# Patient Record
Sex: Female | Born: 1945 | Race: Black or African American | Hispanic: No | Marital: Single | Smoking: Former smoker
Health system: Southern US, Community
[De-identification: ages and names within clinical notes are randomized; demographics above are authoritative.]

## PROBLEM LIST (undated history)

## (undated) DIAGNOSIS — J449 Chronic obstructive pulmonary disease, unspecified: Secondary | ICD-10-CM

## (undated) DIAGNOSIS — I10 Essential (primary) hypertension: Secondary | ICD-10-CM

## (undated) DIAGNOSIS — D649 Anemia, unspecified: Secondary | ICD-10-CM

## (undated) DIAGNOSIS — M1990 Unspecified osteoarthritis, unspecified site: Secondary | ICD-10-CM

## (undated) HISTORY — PX: NO PAST SURGERIES: SHX2092

## (undated) HISTORY — DX: Anemia, unspecified: D64.9

---

## 2015-06-09 ENCOUNTER — Ambulatory Visit: Payer: Self-pay | Admitting: Unknown Physician Specialty

## 2016-09-27 ENCOUNTER — Encounter: Payer: Self-pay | Admitting: *Deleted

## 2016-09-28 ENCOUNTER — Encounter
Admission: RE | Disposition: A | Discharge status: Home / Self Care | Payer: Self-pay | Source: Ambulatory Visit | Attending: Unknown Physician Specialty

## 2016-09-28 ENCOUNTER — Encounter: Payer: Self-pay | Admitting: *Deleted

## 2016-09-28 ENCOUNTER — Ambulatory Visit: Payer: Medicare HMO | Admitting: Anesthesiology

## 2016-09-28 ENCOUNTER — Ambulatory Visit
Admission: RE | Admit: 2016-09-28 | Discharge: 2016-09-28 | Disposition: A | Discharge status: Home / Self Care | Payer: Medicare HMO | Source: Ambulatory Visit | Attending: Unknown Physician Specialty | Admitting: Unknown Physician Specialty

## 2016-09-28 DIAGNOSIS — R1310 Dysphagia, unspecified: Secondary | ICD-10-CM | POA: Insufficient documentation

## 2016-09-28 DIAGNOSIS — K2950 Unspecified chronic gastritis without bleeding: Secondary | ICD-10-CM | POA: Insufficient documentation

## 2016-09-28 DIAGNOSIS — D123 Benign neoplasm of transverse colon: Secondary | ICD-10-CM | POA: Insufficient documentation

## 2016-09-28 DIAGNOSIS — K210 Gastro-esophageal reflux disease with esophagitis: Secondary | ICD-10-CM | POA: Insufficient documentation

## 2016-09-28 DIAGNOSIS — M1990 Unspecified osteoarthritis, unspecified site: Secondary | ICD-10-CM | POA: Diagnosis not present

## 2016-09-28 DIAGNOSIS — J449 Chronic obstructive pulmonary disease, unspecified: Secondary | ICD-10-CM | POA: Insufficient documentation

## 2016-09-28 DIAGNOSIS — I10 Essential (primary) hypertension: Secondary | ICD-10-CM | POA: Insufficient documentation

## 2016-09-28 DIAGNOSIS — K648 Other hemorrhoids: Secondary | ICD-10-CM | POA: Insufficient documentation

## 2016-09-28 DIAGNOSIS — D509 Iron deficiency anemia, unspecified: Secondary | ICD-10-CM | POA: Insufficient documentation

## 2016-09-28 DIAGNOSIS — Z87891 Personal history of nicotine dependence: Secondary | ICD-10-CM | POA: Diagnosis not present

## 2016-09-28 DIAGNOSIS — K319 Disease of stomach and duodenum, unspecified: Secondary | ICD-10-CM | POA: Insufficient documentation

## 2016-09-28 DIAGNOSIS — K449 Diaphragmatic hernia without obstruction or gangrene: Secondary | ICD-10-CM | POA: Diagnosis not present

## 2016-09-28 HISTORY — PX: COLONOSCOPY WITH PROPOFOL: SHX5780

## 2016-09-28 HISTORY — DX: Essential (primary) hypertension: I10

## 2016-09-28 HISTORY — PX: ESOPHAGOGASTRODUODENOSCOPY (EGD) WITH PROPOFOL: SHX5813

## 2016-09-28 HISTORY — DX: Chronic obstructive pulmonary disease, unspecified: J44.9

## 2016-09-28 HISTORY — DX: Unspecified osteoarthritis, unspecified site: M19.90

## 2016-09-28 SURGERY — COLONOSCOPY WITH PROPOFOL
Anesthesia: General

## 2016-09-28 MED ORDER — SODIUM CHLORIDE 0.9 % IV SOLN
INTRAVENOUS | Status: DC
  Administered 2016-09-28: 1000 mL via INTRAVENOUS

## 2016-09-28 MED ORDER — SODIUM CHLORIDE 0.9 % IV SOLN
INTRAVENOUS | Status: DC
  Administered 2016-09-28: 14:00:00 via INTRAVENOUS

## 2016-09-28 MED ORDER — PROPOFOL 500 MG/50ML IV EMUL
INTRAVENOUS | Status: DC | PRN
  Administered 2016-09-28: 50 ug/kg/min via INTRAVENOUS

## 2016-09-28 NOTE — H&P (Signed)
   Primary Care Physician:  Letta Median, MD Primary Gastroenterologist:  Dr. Vira Agar  Pre-Procedure History & Physical: HPI:  Joan Parker is a 69 y.o. female is here for an endoscopy and colonoscopy.   Past Medical History:  Diagnosis Date  . Arthritis   . COPD (chronic obstructive pulmonary disease) ([place])   . Hypertension     Past Surgical History:  Procedure Laterality Date  . NO PAST SURGERIES      Prior to Admission medications   Medication Sig Start Date End Date Taking? Authorizing Provider  alendronate (FOSAMAX) 70 MG tablet Take 70 mg by mouth once a week. Take with a full glass of water on an empty stomach.   Yes Historical Provider, MD  amLODipine (NORVASC) 5 MG tablet Take 5 mg by mouth daily.   Yes Historical Provider, MD  budesonide-formoterol (SYMBICORT) 160-4.5 MCG/ACT inhaler Inhale 2 puffs into the lungs 2 (two) times daily.   Yes Historical Provider, MD  diclofenac (CATAFLAM) 50 MG tablet Take 50 mg by mouth 2 (two) times daily.   Yes Historical Provider, MD  diclofenac (VOLTAREN) 50 MG EC tablet Take 50 mg by mouth 2 (two) times daily as needed.   Yes Historical Provider, MD  ergocalciferol (VITAMIN D2) 50000 units capsule Take 50,000 Units by mouth once a week.   Yes Historical Provider, MD  leflunomide (ARAVA) 20 MG tablet Take 20 mg by mouth daily.   Yes Historical Provider, MD  lisinopril (PRINIVIL,ZESTRIL) 40 MG tablet Take 40 mg by mouth daily.   Yes Historical Provider, MD  predniSONE (DELTASONE) 5 MG tablet Take 5 mg by mouth daily with breakfast. Take 1 and 1/2 tab daily for 30 days   Yes Historical Provider, MD    Allergies as of 09/20/2016  . (Not on File)    History reviewed. No pertinent family history.  Social History   Social History  . Marital status: Single    Spouse name: N/A  . Number of children: N/A  . Years of education: N/A   Occupational History  . Not on file.   Social History Main Topics  . Smoking status: Former  Research scientist (life sciences)  . Smokeless tobacco: Never Used  . Alcohol use No  . Drug use: No  . Sexual activity: Not on file   Other Topics Concern  . Not on file   Social History Narrative  . No narrative on file    Review of Systems: See HPI, otherwise negative ROS  Physical Exam: BP 108/78   Pulse 88   Temp 97.5 F (36.4 C)   Resp 20   Ht 5\' 5"  (1.651 m)   Wt 63 kg (139 lb)   SpO2 98%   BMI 23.13 kg/m  General:   Alert,  pleasant and cooperative in NAD Head:  Normocephalic and atraumatic. Neck:  Supple; no masses or thyromegaly. Lungs:  Clear throughout to auscultation.    Heart:  Regular rate and rhythm. Abdomen:  Soft, nontender and nondistended. Normal bowel sounds, without guarding, and without rebound.   Neurologic:  Alert and  oriented x4;  grossly normal neurologically.  Impression/Plan: Joan Parker is here for an endoscopy and colonoscopy to be performed for iron def anemia  Risks, benefits, limitations, and alternatives regarding  endoscopy and colonoscopy have been reviewed with the patient.  Questions have been answered.  All parties agreeable.   Gaylyn Cheers, MD  09/28/2016, 1:46 PM

## 2016-09-28 NOTE — Op Note (Signed)
[facility] Gastroenterology Patient Name: Joan Parker Procedure Date: 09/28/2016 1:52 PM MRN: [id number] Account #: [id number] Date of Birth: 02-08-1946 Admit Type: Outpatient Age: 70 Room: [facility] ENDO ROOM 4 Gender: Female Note Status: Finalized Procedure:            Colonoscopy Indications:          Iron deficiency anemia Providers:            Manya Silvas, MD Referring MD:         Baxter Kail. Rebeca Alert MD, MD (Referring MD) Medicines:            Propofol per Anesthesia Complications:        No immediate complications. Procedure:            Pre-Anesthesia Assessment:                       - After reviewing the risks and benefits, the patient                        was deemed in satisfactory condition to undergo the                        procedure.                       After obtaining informed consent, the colonoscope was                        passed under direct vision. Throughout the procedure,                        the patient's blood pressure, pulse, and oxygen                        saturations were monitored continuously. The                        Colonoscope was introduced through the anus and                        advanced with certainity to the the cecum, identified                        by appendiceal orifice and ileocecal valve. The                        colonoscopy was performed without difficulty. The                        patient tolerated the procedure well. The quality of                        the bowel preparation was excellent. Findings:      A diminutive polyp was found in the transverse colon. The polyp was       sessile. The polyp was removed with a jumbo cold forceps. Resection and       retrieval were complete.      Internal hemorrhoids were found. The hemorrhoids were small and Grade I       (internal hemorrhoids that do not prolapse).      The exam was  otherwise without abnormality. Impression:           - One diminutive polyp  in the transverse colon, removed                        with a jumbo cold forceps. Resected and retrieved.                       - Internal hemorrhoids.                       - The examination was otherwise normal. Recommendation:       - Await pathology results. Manya Silvas, MD 09/28/2016 2:34:30 PM This report has been signed electronically. Number of Addenda: 0 Note Initiated On: 09/28/2016 1:52 PM Scope Withdrawal Time: 0 hours 10 minutes 8 seconds  Total Procedure Duration: 0 hours 19 minutes 55 seconds       [facility]

## 2016-09-28 NOTE — Transfer of Care (Signed)
Immediate Anesthesia Transfer of Care Note  Patient: Joan Parker  Procedure(s) Performed: Procedure(s): COLONOSCOPY WITH PROPOFOL (N/A) ESOPHAGOGASTRODUODENOSCOPY (EGD) WITH PROPOFOL (N/A)  Patient Location: PACU  Anesthesia Type:General  Level of Consciousness: awake, alert  and oriented  Airway & Oxygen Therapy: Patient Spontanous Breathing and Patient connected to nasal cannula oxygen  Post-op Assessment: Report given to RN and Post -op Vital signs reviewed and stable  Post vital signs: Reviewed and stable  Last Vitals:  Vitals:   09/28/16 1308 09/28/16 1438  BP: 108/78 94/73  Pulse: 88 71  Resp: 20 20  Temp: 36.4 C 36.2 C    Last Pain:  Vitals:   09/28/16 1438  TempSrc: Tympanic         Complications: No apparent anesthesia complications

## 2016-09-28 NOTE — Op Note (Signed)
[facility] Gastroenterology Patient Name: Joan Parker Procedure Date: 09/28/2016 1:52 PM MRN: [id number] Account #: [id number] Date of Birth: [birth year] Admit Type: Outpatient Age: 70 Room: [facility] ENDO ROOM 4 Gender: Female Note Status: Finalized Procedure:            Upper GI endoscopy Indications:          Iron deficiency anemia, Dysphagia Providers:            Manya Silvas, MD Referring MD:         Baxter Kail. Rebeca Alert MD, MD (Referring MD) Medicines:            Propofol per Anesthesia Complications:        No immediate complications. Procedure:            Pre-Anesthesia Assessment:                       - After reviewing the risks and benefits, the patient                        was deemed in satisfactory condition to undergo the                        procedure.                       After obtaining informed consent, the endoscope was                        passed under direct vision. Throughout the procedure,                        the patient's blood pressure, pulse, and oxygen                        saturations were monitored continuously. The                        Colonoscope was introduced through the mouth, and                        advanced to the second part of duodenum. The upper GI                        endoscopy was accomplished without difficulty. The                        patient tolerated the procedure well. Findings:      LA Grade A (one or more mucosal breaks less than 5 mm, not extending       between tops of 2 mucosal folds) esophagitis with no bleeding was found       35 cm from the incisors. A guidewire was placed and the scope was       withdrawn. Dilation was performed with a Savary dilator with no       resistance at 17 mm.      A small hiatal hernia was present.      A single, small non-bleeding erosion was found in the gastric antrum.       There were stigmata of recent bleeding. There were flecks of blood in       the antrum and  body of uncertain origin. Biopsies were taken with a cold       forceps for histology. Biopsies were taken with a cold forceps for       Helicobacter pylori testing.      The examined duodenum was normal. Impression:           - LA Grade A reflux esophagitis. Dilated.                       - Small hiatal hernia.                       - Non-bleeding erosive gastropathy. Biopsied.                       - Normal examined duodenum. Recommendation:       - Await pathology results. Manya Silvas, MD 09/28/2016 2:10:07 PM This report has been signed electronically. Number of Addenda: 0 Note Initiated On: 09/28/2016 1:52 PM      [facility]

## 2016-09-28 NOTE — Anesthesia Preprocedure Evaluation (Addendum)
Anesthesia Evaluation  Patient identified by MRN, date of birth, ID band Patient awake    Reviewed: Allergy & Precautions  Airway Mallampati: II       Dental  (+) Upper Dentures, Lower Dentures   Pulmonary COPD,  COPD inhaler, former smoker,     + decreased breath sounds      Cardiovascular Exercise Tolerance: Good hypertension, Pt. on medications  Rhythm:Regular     Neuro/Psych negative neurological ROS     GI/Hepatic negative GI ROS, Neg liver ROS,   Endo/Other  negative endocrine ROS  Renal/GU negative Renal ROS     Musculoskeletal   Abdominal Normal abdominal exam  (+)   Peds  Hematology negative hematology ROS (+)   Anesthesia Other Findings   Reproductive/Obstetrics                             Anesthesia Physical Anesthesia Plan  ASA: III  Anesthesia Plan: General   Post-op Pain Management:    Induction: Intravenous  Airway Management Planned: Natural Airway and Nasal Cannula  Additional Equipment:   Intra-op Plan:   Post-operative Plan:   Informed Consent: I have reviewed the patients History and Physical, chart, labs and discussed the procedure including the risks, benefits and alternatives for the proposed anesthesia with the patient or authorized representative who has indicated his/her understanding and acceptance.     Plan Discussed with: CRNA  Anesthesia Plan Comments:         Anesthesia Quick Evaluation

## 2016-09-29 ENCOUNTER — Encounter: Payer: Self-pay | Admitting: Unknown Physician Specialty

## 2016-09-30 LAB — SURGICAL PATHOLOGY

## 2016-09-30 NOTE — Anesthesia Postprocedure Evaluation (Signed)
Anesthesia Post Note  Patient: Darl Householder  Procedure(s) Performed: Procedure(s) (LRB): COLONOSCOPY WITH PROPOFOL (N/A) ESOPHAGOGASTRODUODENOSCOPY (EGD) WITH PROPOFOL (N/A)  Patient location during evaluation: PACU Anesthesia Type: General Level of consciousness: awake Pain management: pain level controlled Vital Signs Assessment: post-procedure vital signs reviewed and stable Cardiovascular status: stable Anesthetic complications: no    Last Vitals:  Vitals:   09/28/16 1458 09/28/16 1508  BP: 138/84 (!) 143/85  Pulse: 64 66  Resp: 16 15  Temp:      Last Pain:  Vitals:   09/29/16 0744  TempSrc:   PainSc: 0-No pain                 VAN STAVEREN,Octaviano Mukai

## 2016-12-19 ENCOUNTER — Other Ambulatory Visit: Payer: Self-pay | Admitting: Internal Medicine

## 2016-12-19 DIAGNOSIS — R0781 Pleurodynia: Secondary | ICD-10-CM

## 2016-12-20 ENCOUNTER — Ambulatory Visit
Admission: RE | Admit: 2016-12-20 | Discharge: 2016-12-20 | Disposition: A | Discharge status: Home / Self Care | Payer: Medicare HMO | Source: Ambulatory Visit | Attending: Internal Medicine | Admitting: Internal Medicine

## 2016-12-20 DIAGNOSIS — R0781 Pleurodynia: Secondary | ICD-10-CM | POA: Diagnosis not present

## 2016-12-20 DIAGNOSIS — J439 Emphysema, unspecified: Secondary | ICD-10-CM | POA: Diagnosis not present

## 2016-12-20 DIAGNOSIS — M4854XA Collapsed vertebra, not elsewhere classified, thoracic region, initial encounter for fracture: Secondary | ICD-10-CM | POA: Diagnosis not present

## 2016-12-20 DIAGNOSIS — J984 Other disorders of lung: Secondary | ICD-10-CM | POA: Diagnosis not present

## 2016-12-20 DIAGNOSIS — I77810 Thoracic aortic ectasia: Secondary | ICD-10-CM | POA: Insufficient documentation

## 2016-12-20 MED ORDER — IOPAMIDOL (ISOVUE-300) INJECTION 61%: 100.0000 mL | Freq: Once | INTRAVENOUS | Status: DC | PRN

## 2016-12-20 MED ORDER — IOPAMIDOL (ISOVUE-370) INJECTION 76%
100.0000 mL | Freq: Once | INTRAVENOUS | Status: AC | PRN
  Administered 2016-12-20: 85 mL via INTRAVENOUS

## 2017-02-08 ENCOUNTER — Other Ambulatory Visit: Payer: Self-pay | Admitting: Family Medicine

## 2017-02-08 DIAGNOSIS — R1319 Other dysphagia: Secondary | ICD-10-CM

## 2017-02-14 ENCOUNTER — Ambulatory Visit: Admission: RE | Admit: 2017-02-14 | Payer: Medicare HMO | Source: Ambulatory Visit

## 2017-09-19 ENCOUNTER — Other Ambulatory Visit: Payer: Self-pay | Admitting: Family Medicine

## 2017-09-19 DIAGNOSIS — Z1231 Encounter for screening mammogram for malignant neoplasm of breast: Secondary | ICD-10-CM

## 2017-10-09 ENCOUNTER — Ambulatory Visit
Admission: RE | Admit: 2017-10-09 | Discharge: 2017-10-09 | Disposition: A | Discharge status: Home / Self Care | Payer: Medicare HMO | Source: Ambulatory Visit | Attending: Family Medicine | Admitting: Family Medicine

## 2017-10-09 DIAGNOSIS — Z1231 Encounter for screening mammogram for malignant neoplasm of breast: Secondary | ICD-10-CM

## 2017-10-11 ENCOUNTER — Other Ambulatory Visit: Payer: Self-pay | Admitting: Family Medicine

## 2017-10-11 DIAGNOSIS — N6489 Other specified disorders of breast: Secondary | ICD-10-CM

## 2017-10-11 DIAGNOSIS — R928 Other abnormal and inconclusive findings on diagnostic imaging of breast: Secondary | ICD-10-CM

## 2017-10-26 ENCOUNTER — Ambulatory Visit
Admission: RE | Admit: 2017-10-26 | Discharge: 2017-10-26 | Disposition: A | Discharge status: Home / Self Care | Payer: Medicare HMO | Source: Ambulatory Visit | Attending: Family Medicine | Admitting: Family Medicine

## 2017-10-26 DIAGNOSIS — N6489 Other specified disorders of breast: Secondary | ICD-10-CM | POA: Insufficient documentation

## 2017-10-26 DIAGNOSIS — R928 Other abnormal and inconclusive findings on diagnostic imaging of breast: Secondary | ICD-10-CM

## 2018-07-12 ENCOUNTER — Other Ambulatory Visit: Payer: Self-pay | Admitting: Student

## 2018-07-12 DIAGNOSIS — R1310 Dysphagia, unspecified: Secondary | ICD-10-CM

## 2018-07-12 DIAGNOSIS — R1319 Other dysphagia: Secondary | ICD-10-CM

## 2018-07-18 ENCOUNTER — Ambulatory Visit
Admission: RE | Admit: 2018-07-18 | Discharge: 2018-07-18 | Disposition: A | Discharge status: Home / Self Care | Payer: Medicare HMO | Source: Ambulatory Visit | Attending: Student | Admitting: Student

## 2018-07-18 DIAGNOSIS — R1319 Other dysphagia: Secondary | ICD-10-CM

## 2018-07-18 DIAGNOSIS — R1310 Dysphagia, unspecified: Secondary | ICD-10-CM | POA: Insufficient documentation

## 2018-09-17 ENCOUNTER — Other Ambulatory Visit: Payer: Self-pay | Admitting: Family Medicine

## 2018-09-17 DIAGNOSIS — Z1231 Encounter for screening mammogram for malignant neoplasm of breast: Secondary | ICD-10-CM

## 2019-04-01 DIAGNOSIS — J449 Chronic obstructive pulmonary disease, unspecified: Secondary | ICD-10-CM | POA: Diagnosis not present

## 2019-04-04 DIAGNOSIS — J439 Emphysema, unspecified: Secondary | ICD-10-CM | POA: Diagnosis not present

## 2019-04-23 DIAGNOSIS — D509 Iron deficiency anemia, unspecified: Secondary | ICD-10-CM | POA: Diagnosis not present

## 2019-04-23 DIAGNOSIS — I10 Essential (primary) hypertension: Secondary | ICD-10-CM | POA: Diagnosis not present

## 2019-04-23 DIAGNOSIS — N183 Chronic kidney disease, stage 3 (moderate): Secondary | ICD-10-CM | POA: Diagnosis not present

## 2019-04-23 DIAGNOSIS — D508 Other iron deficiency anemias: Secondary | ICD-10-CM | POA: Diagnosis not present

## 2019-04-24 DIAGNOSIS — M0579 Rheumatoid arthritis with rheumatoid factor of multiple sites without organ or systems involvement: Secondary | ICD-10-CM | POA: Diagnosis not present

## 2019-04-30 DIAGNOSIS — Z0000 Encounter for general adult medical examination without abnormal findings: Secondary | ICD-10-CM | POA: Diagnosis not present

## 2019-04-30 DIAGNOSIS — Z136 Encounter for screening for cardiovascular disorders: Secondary | ICD-10-CM | POA: Diagnosis not present

## 2019-04-30 DIAGNOSIS — I10 Essential (primary) hypertension: Secondary | ICD-10-CM | POA: Diagnosis not present

## 2019-04-30 DIAGNOSIS — K219 Gastro-esophageal reflux disease without esophagitis: Secondary | ICD-10-CM | POA: Diagnosis not present

## 2019-04-30 DIAGNOSIS — D508 Other iron deficiency anemias: Secondary | ICD-10-CM | POA: Diagnosis not present

## 2019-04-30 DIAGNOSIS — N183 Chronic kidney disease, stage 3 (moderate): Secondary | ICD-10-CM | POA: Diagnosis not present

## 2019-05-02 DIAGNOSIS — J449 Chronic obstructive pulmonary disease, unspecified: Secondary | ICD-10-CM | POA: Diagnosis not present

## 2019-05-14 ENCOUNTER — Encounter: Payer: Self-pay | Admitting: Internal Medicine

## 2019-05-14 ENCOUNTER — Other Ambulatory Visit: Payer: Self-pay

## 2019-05-14 ENCOUNTER — Inpatient Hospital Stay: Payer: Medicare HMO | Attending: Internal Medicine | Admitting: Internal Medicine

## 2019-05-14 ENCOUNTER — Inpatient Hospital Stay: Payer: Medicare HMO

## 2019-05-14 DIAGNOSIS — D509 Iron deficiency anemia, unspecified: Secondary | ICD-10-CM | POA: Insufficient documentation

## 2019-05-14 DIAGNOSIS — I10 Essential (primary) hypertension: Secondary | ICD-10-CM | POA: Insufficient documentation

## 2019-05-14 DIAGNOSIS — Z79899 Other long term (current) drug therapy: Secondary | ICD-10-CM | POA: Diagnosis not present

## 2019-05-14 DIAGNOSIS — Z87891 Personal history of nicotine dependence: Secondary | ICD-10-CM | POA: Insufficient documentation

## 2019-05-14 DIAGNOSIS — J449 Chronic obstructive pulmonary disease, unspecified: Secondary | ICD-10-CM | POA: Insufficient documentation

## 2019-05-14 DIAGNOSIS — Z7951 Long term (current) use of inhaled steroids: Secondary | ICD-10-CM | POA: Insufficient documentation

## 2019-05-14 DIAGNOSIS — N189 Chronic kidney disease, unspecified: Secondary | ICD-10-CM | POA: Diagnosis not present

## 2019-05-14 LAB — CBC WITH DIFFERENTIAL/PLATELET
Abs Immature Granulocytes: 0.02 10*3/uL (ref 0.00–0.07)
Basophils Absolute: 0.1 10*3/uL (ref 0.0–0.1)
Basophils Relative: 1 %
Eosinophils Absolute: 0.2 10*3/uL (ref 0.0–0.5)
Eosinophils Relative: 3 %
HCT: 33.6 % — ABNORMAL LOW (ref 36.0–46.0)
Hemoglobin: 9.8 g/dL — ABNORMAL LOW (ref 12.0–15.0)
Immature Granulocytes: 0 %
Lymphocytes Relative: 15 %
Lymphs Abs: 1.1 10*3/uL (ref 0.7–4.0)
MCH: 22.3 pg — ABNORMAL LOW (ref 26.0–34.0)
MCHC: 29.2 g/dL — ABNORMAL LOW (ref 30.0–36.0)
MCV: 76.5 fL — ABNORMAL LOW (ref 80.0–100.0)
Monocytes Absolute: 0.6 10*3/uL (ref 0.1–1.0)
Monocytes Relative: 8 %
Neutro Abs: 5.3 10*3/uL (ref 1.7–7.7)
Neutrophils Relative %: 73 %
Platelets: 201 10*3/uL (ref 150–400)
RBC: 4.39 MIL/uL (ref 3.87–5.11)
RDW: 15.7 % — ABNORMAL HIGH (ref 11.5–15.5)
WBC: 7.3 10*3/uL (ref 4.0–10.5)
nRBC: 0 % (ref 0.0–0.2)

## 2019-05-14 LAB — URINALYSIS, COMPLETE (UACMP) WITH MICROSCOPIC
Bacteria, UA: NONE SEEN
Bilirubin Urine: NEGATIVE
Glucose, UA: NEGATIVE mg/dL
Hgb urine dipstick: NEGATIVE
Ketones, ur: NEGATIVE mg/dL
Nitrite: NEGATIVE
Protein, ur: NEGATIVE mg/dL
Specific Gravity, Urine: 1.021 (ref 1.005–1.030)
pH: 6 (ref 5.0–8.0)

## 2019-05-14 LAB — COMPREHENSIVE METABOLIC PANEL
ALT: 13 U/L (ref 0–44)
AST: 22 U/L (ref 15–41)
Albumin: 4.2 g/dL (ref 3.5–5.0)
Alkaline Phosphatase: 48 U/L (ref 38–126)
Anion gap: 8 (ref 5–15)
BUN: 34 mg/dL — ABNORMAL HIGH (ref 8–23)
CO2: 30 mmol/L (ref 22–32)
Calcium: 9.6 mg/dL (ref 8.9–10.3)
Chloride: 104 mmol/L (ref 98–111)
Creatinine, Ser: 1.48 mg/dL — ABNORMAL HIGH (ref 0.44–1.00)
GFR calc Af Amer: 40 mL/min — ABNORMAL LOW (ref >60)
GFR calc non Af Amer: 35 mL/min — ABNORMAL LOW (ref >60)
Glucose, Bld: 90 mg/dL (ref 70–99)
Potassium: 4.3 mmol/L (ref 3.5–5.1)
Sodium: 142 mmol/L (ref 135–145)
Total Bilirubin: 0.3 mg/dL (ref 0.3–1.2)
Total Protein: 7.5 g/dL (ref 6.5–8.1)

## 2019-05-14 LAB — RETICULOCYTES
Immature Retic Fract: 6.4 % (ref 2.3–15.9)
RBC.: 4.39 MIL/uL (ref 3.87–5.11)
Retic Count, Absolute: 36 10*3/uL (ref 19.0–186.0)
Retic Ct Pct: 0.8 % (ref 0.4–3.1)

## 2019-05-14 LAB — C-REACTIVE PROTEIN: CRP: 1 mg/dL — ABNORMAL HIGH (ref <1.0)

## 2019-05-14 LAB — LACTATE DEHYDROGENASE: LDH: 131 U/L (ref 98–192)

## 2019-05-14 LAB — TECHNOLOGIST SMEAR REVIEW

## 2019-05-14 NOTE — Progress Notes (Signed)
[facility] NOTE  Patient Care Team: Letta Median, MD as PCP - General (Family Medicine)  CHIEF COMPLAINTS/PURPOSE OF CONSULTATION:    HEMATOLOGY HISTORY  # ANEMIA EGD/ colonoscopy ~ 2017 [Dr.Elliot]- ;capsule-none;  Bone marrow Biopsy-none   # COPD on 2 Lit/24 [Dr.Fleming];   HISTORY OF PRESENTING ILLNESS:  Joan Parker 73 y.o.  female has been referred to [place] for further evaluation/work-up for anemia.  Patient denies any blood in stools or black or stools.  Denies any vaginal bleeding.  No weight loss.  Not on iron supplementation.  No blood in urine.  Patient was to have worsening shortness of breath/fatigue.  She is on 2 L of oxygen.  Review of Systems  Constitutional: Positive for malaise/fatigue. Negative for chills, diaphoresis, fever and weight loss.  HENT: Negative for nosebleeds and sore throat.   Eyes: Negative for double vision.  Respiratory: Negative for cough, hemoptysis, sputum production, shortness of breath and wheezing.   Cardiovascular: Negative for chest pain, palpitations, orthopnea and leg swelling.  Gastrointestinal: Negative for abdominal pain, blood in stool, constipation, diarrhea, heartburn, melena, nausea and vomiting.  Genitourinary: Negative for dysuria, frequency and urgency.  Musculoskeletal: Positive for back pain and joint pain.  Skin: Negative.  Negative for itching and rash.  Neurological: Negative for dizziness, tingling, focal weakness, weakness and headaches.  Endo/Heme/Allergies: Does not bruise/bleed easily.  Psychiatric/Behavioral: Negative for depression. The patient is not nervous/anxious and does not have insomnia.     MEDICAL HISTORY:  Past Medical History:  Diagnosis Date  . Anemia   . Arthritis   . COPD (chronic obstructive pulmonary disease) ([place])   . Hypertension     SURGICAL HISTORY: Past Surgical History:  Procedure Laterality Date  . COLONOSCOPY WITH PROPOFOL N/A 09/28/2016   Procedure:  COLONOSCOPY WITH PROPOFOL;  Surgeon: Manya Silvas, MD;  Location: [facility] ENDOSCOPY;  Service: Endoscopy;  Laterality: N/A;  . ESOPHAGOGASTRODUODENOSCOPY (EGD) WITH PROPOFOL N/A 09/28/2016   Procedure: ESOPHAGOGASTRODUODENOSCOPY (EGD) WITH PROPOFOL;  Surgeon: Manya Silvas, MD;  [facility] ENDOSCOPY;  Service: Endoscopy;  Laterality: N/A;    SOCIAL HISTORY: Social History   Socioeconomic History  . Marital status: Single    Spouse name: Not on file  . Number of children: Not on file  . Years of education: Not on file  . Highest education level: Not on file  Occupational History  . Not on file  Social Needs  . Financial resource strain: Not on file  . Food insecurity    Worry: Not on file    Inability: Not on file  . Transportation needs    Medical: Not on file    Non-medical: Not on file  Tobacco Use  . Smoking status: Former Smoker    Packs/day: 1.00    Years: 15.00    Pack years: 15.00    Types: Cigarettes  . Smokeless tobacco: Never Used  Substance and Sexual Activity  . Alcohol use: No  . Drug use: No  . Sexual activity: Not on file  Lifestyle  . Physical activity    Days per week: Not on file    Minutes per session: Not on file  . Stress: Not on file  Relationships  . Social Herbalist on phone: Not on file    Gets together: Not on file    Attends religious service: Not on file    Active member of club or organization: Not on file    Attends meetings  of clubs or organizations: Not on file    Relationship status: Not on file  . Intimate partner violence    Fear of current or ex partner: Not on file    Emotionally abused: Not on file    Physically abused: Not on file    Forced sexual activity: Not on file  Other Topics Concern  . Not on file  Social History Narrative  . Not on file    FAMILY HISTORY: Family History  Problem Relation Age of Onset  . Diabetes Father     ALLERGIES:  has No Known Allergies.  MEDICATIONS:  Current  Outpatient Medications  Medication Sig Dispense Refill  . albuterol (VENTOLIN HFA) 108 (90 Base) MCG/ACT inhaler Inhale 2 Inhalers into the lungs every 4 (four) hours as needed for shortness of breath.    Marland Kitchen alendronate (FOSAMAX) 70 MG tablet Take 70 mg by mouth once a week. Take with a full glass of water on an empty stomach.    . budesonide-formoterol (SYMBICORT) 160-4.5 MCG/ACT inhaler Inhale 2 puffs into the lungs 2 (two) times daily.    Marland Kitchen CALCIUM 600-D 600-400 MG-UNIT TABS Take 1 tablet by mouth 2 (two) times daily with a meal.    . ergocalciferol (VITAMIN D2) 50000 units capsule Take 50,000 Units by mouth once a week.    Marland Kitchen lisinopril (PRINIVIL,ZESTRIL) 40 MG tablet Take 40 mg by mouth daily.    Marland Kitchen omeprazole (PRILOSEC) 20 MG capsule Take 1 capsule by mouth daily.    . OXYGEN Inhale 2 L/min into the lungs as needed (shortness of breath).    . tiotropium (SPIRIVA HANDIHALER) 18 MCG inhalation capsule Take 1 capsule by mouth daily.     No current facility-administered medications for this visit.       PHYSICAL EXAMINATION:   Vitals:   05/14/19 1034  BP: 90/68  Pulse: 80  Resp: (!) 22  Temp: (!) 96.2 F (35.7 C)  SpO2: 97%   Filed Weights   05/14/19 1034  Weight: 124 lb (56.2 kg)    Physical Exam  Constitutional: She is oriented to person, place, and time and well-developed, well-nourished, and in no distress.  HENT:  Head: Normocephalic and atraumatic.  Mouth/Throat: Oropharynx is clear and moist. No oropharyngeal exudate.  Eyes: Pupils are equal, round, and reactive to light.  Neck: Normal range of motion. Neck supple.  Cardiovascular: Normal rate and regular rhythm.  Pulmonary/Chest: No respiratory distress. She has no wheezes.  Decreased air entry bilaterally.  Abdominal: Soft. Bowel sounds are normal. She exhibits no distension and no mass. There is no abdominal tenderness. There is no rebound and no guarding.  Musculoskeletal: Normal range of motion.         General: No tenderness or edema.  Neurological: She is alert and oriented to person, place, and time.  Skin: Skin is warm.  Psychiatric: Affect normal.    LABORATORY DATA:  I have reviewed the data as listed Lab Results  Component Value Date   WBC 7.3 05/14/2019   HGB 9.8 (L) 05/14/2019   HCT 33.6 (L) 05/14/2019   MCV 76.5 (L) 05/14/2019   PLT 201 05/14/2019   Recent Labs    05/14/19 1135  NA 142  K 4.3  CL 104  CO2 30  GLUCOSE 90  BUN 34*  CREATININE 1.48*  CALCIUM 9.6  GFRNONAA 35*  GFRAA 40*  PROT 7.5  ALBUMIN 4.2  AST 22  ALT 13  ALKPHOS 48  BILITOT 0.3  No results found. Microcytic anemia # chronic microcytic anemia-hemoglobin around 10.  Iron deficiency versus chronic kidney disease.  Recommend CBC CMP LDH iron studies ferritin myeloma panel.  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  # COPD-home O2 stable.  # labs today # follow up in 2 weeks-MD; no labs- Dr.B  Thank you Dr. Netty Starring- for allowing me to participate in the care of your pleasant patient. Please do not hesitate to contact me with questions or concerns in the interim.   All questions were answered. The patient knows to call the clinic with any problems, questions or concerns.    Cammie Sickle, MD 05/22/2019 7:56 AM

## 2019-05-14 NOTE — Assessment & Plan Note (Addendum)
#   chronic microcytic anemia-hemoglobin around 10.  Iron deficiency versus chronic kidney disease.  Recommend CBC CMP LDH iron studies ferritin myeloma panel.  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  Await above myeloma work-up  # COPD-home O2 stable.  # labs today # follow up in 2 weeks-MD; no labs- Dr.B  Thank you Dr. Netty Starring- for allowing me to participate in the care of your pleasant patient. Please do not hesitate to contact me with questions or concerns in the interim.

## 2019-05-15 LAB — HEMOGLOBINOPATHY EVALUATION
Hgb A2 Quant: 1.8 % (ref 1.8–3.2)
Hgb A: 98.2 % (ref 96.4–98.8)
Hgb C: 0 %
Hgb F Quant: 0 % (ref 0.0–2.0)
Hgb S Quant: 0 %
Hgb Variant: 0 %

## 2019-05-15 LAB — KAPPA/LAMBDA LIGHT CHAINS
Kappa free light chain: 28.6 mg/L — ABNORMAL HIGH (ref 3.3–19.4)
Kappa, lambda light chain ratio: 1.44 (ref 0.26–1.65)
Lambda free light chains: 19.8 mg/L (ref 5.7–26.3)

## 2019-05-15 LAB — HAPTOGLOBIN: Haptoglobin: 210 mg/dL (ref 42–346)

## 2019-05-16 LAB — IMMUNOFIXATION ELECTROPHORESIS
IgA: 255 mg/dL (ref 64–422)
IgG (Immunoglobin G), Serum: 854 mg/dL (ref 586–1602)
IgM (Immunoglobulin M), Srm: 97 mg/dL (ref 26–217)
Total Protein ELP: 6.7 g/dL (ref 6.0–8.5)

## 2019-05-17 DIAGNOSIS — N189 Chronic kidney disease, unspecified: Secondary | ICD-10-CM | POA: Diagnosis not present

## 2019-05-17 DIAGNOSIS — D509 Iron deficiency anemia, unspecified: Secondary | ICD-10-CM | POA: Diagnosis not present

## 2019-05-17 DIAGNOSIS — Z7951 Long term (current) use of inhaled steroids: Secondary | ICD-10-CM | POA: Diagnosis not present

## 2019-05-17 DIAGNOSIS — Z79899 Other long term (current) drug therapy: Secondary | ICD-10-CM | POA: Diagnosis not present

## 2019-05-17 DIAGNOSIS — J449 Chronic obstructive pulmonary disease, unspecified: Secondary | ICD-10-CM | POA: Diagnosis not present

## 2019-05-17 DIAGNOSIS — Z87891 Personal history of nicotine dependence: Secondary | ICD-10-CM | POA: Diagnosis not present

## 2019-05-17 DIAGNOSIS — I10 Essential (primary) hypertension: Secondary | ICD-10-CM | POA: Diagnosis not present

## 2019-05-19 DIAGNOSIS — D509 Iron deficiency anemia, unspecified: Secondary | ICD-10-CM | POA: Diagnosis not present

## 2019-05-21 DIAGNOSIS — D509 Iron deficiency anemia, unspecified: Secondary | ICD-10-CM | POA: Diagnosis not present

## 2019-05-27 DIAGNOSIS — M0579 Rheumatoid arthritis with rheumatoid factor of multiple sites without organ or systems involvement: Secondary | ICD-10-CM | POA: Diagnosis not present

## 2019-05-28 ENCOUNTER — Other Ambulatory Visit: Payer: Self-pay

## 2019-05-28 ENCOUNTER — Telehealth: Payer: Self-pay | Admitting: Internal Medicine

## 2019-05-28 NOTE — Telephone Encounter (Signed)
Spoke with pt to confirm appt date/time, do pre-appt screen which was completed, and adv of Covid-19 guidelines for appt regarding screening questions, temperature check, face mask required, and no visitors allowed °

## 2019-05-29 ENCOUNTER — Inpatient Hospital Stay: Payer: Medicare HMO | Attending: Internal Medicine | Admitting: Internal Medicine

## 2019-05-29 ENCOUNTER — Other Ambulatory Visit: Payer: Self-pay

## 2019-05-29 DIAGNOSIS — D509 Iron deficiency anemia, unspecified: Secondary | ICD-10-CM | POA: Diagnosis not present

## 2019-05-29 DIAGNOSIS — Z87891 Personal history of nicotine dependence: Secondary | ICD-10-CM | POA: Diagnosis not present

## 2019-05-29 LAB — OCCULT BLOOD X 1 CARD TO LAB, STOOL
Fecal Occult Bld: NEGATIVE
Fecal Occult Bld: NEGATIVE
Fecal Occult Bld: NEGATIVE

## 2019-05-29 NOTE — Assessment & Plan Note (Addendum)
#  chronic microcytic anemia-hemoglobin around 9-10.  Chronic kidney disease versus others; iron saturation 31% May 2020. #Reviewed with the patient that myeloma work-up/hemolysis work-up is negative.  Discussed regarding possible bone marrow biopsy if significantly worse at next visit.  Also discussed regarding use of Aranesp if hemoglobin continues to drop.   # Continue p.o. iron every other day.  Will reevaluate in 3 months  # CKD creat 1.48-stage III.  Stable.  # COPD-home O2 stable.  # DISPOSITION: # follow up in 3 months- MD; labs- cbc/bmp/Iron studies/ferritin- Dr.B  # 25 minutes face-to-face with the patient discussing the above plan of care; more than 50% of time spent on prognosis/ natural history; counseling and coordination.

## 2019-05-29 NOTE — Progress Notes (Signed)
[facility] NOTE  Patient Care Team: Letta Median, MD as PCP - General (Family Medicine)  CHIEF COMPLAINTS/PURPOSE OF CONSULTATION:   HEMATOLOGY HISTORY  # MICROCYTIC ANEMIA EGD/ colonoscopy ~ 2017 [Dr.Elliot]- ? CKD;[No hemolysis/MM-work up-[place]]; May 2020- sat-31%; continue p.o. iron; ? Aranesp.  # COPD on 2 Lit/24 [Dr.Fleming]; CKD stage III [previously nephrologist]  HISTORY OF PRESENTING ILLNESS:  Joan Parker 73 y.o.  female is here for follow-up for ongoing anemia.  Patient complains of fatigue.  Complains of chronic mild joint pain/back pain.  Not any worse.  No blood in stools black-colored stools or vaginal bleeding or blood in urine.  Review of Systems  Constitutional: Positive for malaise/fatigue. Negative for chills, diaphoresis, fever and weight loss.  HENT: Negative for nosebleeds and sore throat.   Eyes: Negative for double vision.  Respiratory: Negative for cough, hemoptysis, sputum production, shortness of breath and wheezing.   Cardiovascular: Negative for chest pain, palpitations, orthopnea and leg swelling.  Gastrointestinal: Negative for abdominal pain, blood in stool, constipation, diarrhea, heartburn, melena, nausea and vomiting.  Genitourinary: Negative for dysuria, frequency and urgency.  Musculoskeletal: Positive for back pain and joint pain.  Skin: Negative.  Negative for itching and rash.  Neurological: Negative for dizziness, tingling, focal weakness, weakness and headaches.  Endo/Heme/Allergies: Does not bruise/bleed easily.  Psychiatric/Behavioral: Negative for depression. The patient is not nervous/anxious and does not have insomnia.     MEDICAL HISTORY:  Past Medical History:  Diagnosis Date  . Anemia   . Arthritis   . COPD (chronic obstructive pulmonary disease) ([place])   . Hypertension     SURGICAL HISTORY: Past Surgical History:  Procedure Laterality Date  . COLONOSCOPY WITH PROPOFOL N/A 09/28/2016    Procedure: COLONOSCOPY WITH PROPOFOL;  Surgeon: Manya Silvas, MD;  Location[facility] ENDOSCOPY;  Service: Endoscopy;  Laterality: N/A;  . ESOPHAGOGASTRODUODENOSCOPY (EGD) WITH PROPOFOL N/A 09/28/2016   Procedure: ESOPHAGOGASTRODUODENOSCOPY (EGD) WITH PROPOFOL;  Surgeon: Manya Silvas, MD;  Location: [facility] ENDOSCOPY;  Service: Endoscopy;  Laterality: N/A;    SOCIAL HISTORY: Social History   Socioeconomic History  . Marital status: Single    Spouse name: Not on file  . Number of children: Not on file  . Years of education: Not on file  . Highest education level: Not on file  Occupational History  . Not on file  Social Needs  . Financial resource strain: Not on file  . Food insecurity    Worry: Not on file    Inability: Not on file  . Transportation needs    Medical: Not on file    Non-medical: Not on file  Tobacco Use  . Smoking status: Former Smoker    Packs/day: 1.00    Years: 15.00    Pack years: 15.00    Types: Cigarettes  . Smokeless tobacco: Never Used  Substance and Sexual Activity  . Alcohol use: No  . Drug use: No  . Sexual activity: Not on file  Lifestyle  . Physical activity    Days per week: Not on file    Minutes per session: Not on file  . Stress: Not on file  Relationships  . Social Herbalist on phone: Not on file    Gets together: Not on file    Attends religious service: Not on file    Active member of club or organization: Not on file    Attends meetings of clubs or organizations: Not on file  Relationship status: Not on file  . Intimate partner violence    Fear of current or ex partner: Not on file    Emotionally abused: Not on file    Physically abused: Not on file    Forced sexual activity: Not on file  Other Topics Concern  . Not on file  Social History Narrative  . Not on file    FAMILY HISTORY: Family History  Problem Relation Age of Onset  . Diabetes Father     ALLERGIES:  has No Known Allergies.  MEDICATIONS:   Current Outpatient Medications  Medication Sig Dispense Refill  . albuterol (VENTOLIN HFA) 108 (90 Base) MCG/ACT inhaler Inhale 2 Inhalers into the lungs every 4 (four) hours as needed for shortness of breath.    Marland Kitchen alendronate (FOSAMAX) 70 MG tablet Take 70 mg by mouth once a week. Take with a full glass of water on an empty stomach.    . budesonide-formoterol (SYMBICORT) 160-4.5 MCG/ACT inhaler Inhale 2 puffs into the lungs 2 (two) times daily.    Marland Kitchen CALCIUM 600-D 600-400 MG-UNIT TABS Take 1 tablet by mouth 2 (two) times daily with a meal.    . ergocalciferol (VITAMIN D2) 50000 units capsule Take 50,000 Units by mouth once a week.    Marland Kitchen lisinopril (PRINIVIL,ZESTRIL) 40 MG tablet Take 40 mg by mouth daily.    Marland Kitchen omeprazole (PRILOSEC) 20 MG capsule Take 1 capsule by mouth daily.    . OXYGEN Inhale 2 L/min into the lungs as needed (shortness of breath).    . tiotropium (SPIRIVA HANDIHALER) 18 MCG inhalation capsule Take 1 capsule by mouth daily.     No current facility-administered medications for this visit.       PHYSICAL EXAMINATION:   Vitals:   05/29/19 0856  BP: 119/78  Pulse: 81  Resp: 20  Temp: (!) 96.8 F (36 C)   Filed Weights   05/29/19 0856  Weight: 125 lb (56.7 kg)    Physical Exam  Constitutional: She is oriented to person, place, and time and well-developed, well-nourished, and in no distress.  HENT:  Head: Normocephalic and atraumatic.  Mouth/Throat: Oropharynx is clear and moist. No oropharyngeal exudate.  Eyes: Pupils are equal, round, and reactive to light.  Neck: Normal range of motion. Neck supple.  Cardiovascular: Normal rate and regular rhythm.  Pulmonary/Chest: No respiratory distress. She has no wheezes.  Decreased air entry bilaterally.  Abdominal: Soft. Bowel sounds are normal. She exhibits no distension and no mass. There is no abdominal tenderness. There is no rebound and no guarding.  Musculoskeletal: Normal range of motion.        General: No  tenderness or edema.  Neurological: She is alert and oriented to person, place, and time.  Skin: Skin is warm.  Psychiatric: Affect normal.    LABORATORY DATA:  I have reviewed the data as listed Lab Results  Component Value Date   WBC 7.3 05/14/2019   HGB 9.8 (L) 05/14/2019   HCT 33.6 (L) 05/14/2019   MCV 76.5 (L) 05/14/2019   PLT 201 05/14/2019   Recent Labs    05/14/19 1135  NA 142  K 4.3  CL 104  CO2 30  GLUCOSE 90  BUN 34*  CREATININE 1.48*  CALCIUM 9.6  GFRNONAA 35*  GFRAA 40*  PROT 7.5  ALBUMIN 4.2  AST 22  ALT 13  ALKPHOS 48  BILITOT 0.3     No results found. Microcytic anemia # chronic microcytic anemia-hemoglobin around 9-10.  Chronic  kidney disease versus others; iron saturation 31% May 2020. #Reviewed with the patient that myeloma work-up/hemolysis work-up is negative.  Discussed regarding possible bone marrow biopsy if significantly worse at next visit.  Also discussed regarding use of Aranesp if hemoglobin continues to drop.   # Continue p.o. iron every other day.  Will reevaluate in 3 months  # CKD creat 1.48-stage III.  Stable.  # COPD-home O2 stable.  # DISPOSITION: # follow up in 3 months- MD; labs- cbc/bmp/Iron studies/ferritin- Dr.B  # 25 minutes face-to-face with the patient discussing the above plan of care; more than 50% of time spent on prognosis/ natural history; counseling and coordination.      All questions were answered. The patient knows to call the clinic with any problems, questions or concerns.    Cammie Sickle, MD 05/29/2019 12:34 PM

## 2019-06-01 DIAGNOSIS — J449 Chronic obstructive pulmonary disease, unspecified: Secondary | ICD-10-CM | POA: Diagnosis not present

## 2019-06-03 ENCOUNTER — Other Ambulatory Visit: Payer: Self-pay | Admitting: Family Medicine

## 2019-06-03 DIAGNOSIS — Z1231 Encounter for screening mammogram for malignant neoplasm of breast: Secondary | ICD-10-CM

## 2019-06-24 DIAGNOSIS — M810 Age-related osteoporosis without current pathological fracture: Secondary | ICD-10-CM | POA: Diagnosis not present

## 2019-06-24 DIAGNOSIS — N183 Chronic kidney disease, stage 3 (moderate): Secondary | ICD-10-CM | POA: Diagnosis not present

## 2019-06-24 DIAGNOSIS — M059 Rheumatoid arthritis with rheumatoid factor, unspecified: Secondary | ICD-10-CM | POA: Diagnosis not present

## 2019-06-24 DIAGNOSIS — Z79899 Other long term (current) drug therapy: Secondary | ICD-10-CM | POA: Diagnosis not present

## 2019-06-24 DIAGNOSIS — M0579 Rheumatoid arthritis with rheumatoid factor of multiple sites without organ or systems involvement: Secondary | ICD-10-CM | POA: Diagnosis not present

## 2019-07-02 DIAGNOSIS — J449 Chronic obstructive pulmonary disease, unspecified: Secondary | ICD-10-CM | POA: Diagnosis not present

## 2019-07-11 ENCOUNTER — Ambulatory Visit
Admission: RE | Admit: 2019-07-11 | Discharge: 2019-07-11 | Disposition: A | Discharge status: Home / Self Care | Payer: Medicare HMO | Source: Ambulatory Visit | Attending: Family Medicine | Admitting: Family Medicine

## 2019-07-11 DIAGNOSIS — Z1231 Encounter for screening mammogram for malignant neoplasm of breast: Secondary | ICD-10-CM | POA: Insufficient documentation

## 2019-07-16 DIAGNOSIS — I950 Idiopathic hypotension: Secondary | ICD-10-CM | POA: Diagnosis not present

## 2019-07-16 DIAGNOSIS — R0600 Dyspnea, unspecified: Secondary | ICD-10-CM | POA: Diagnosis not present

## 2019-07-16 DIAGNOSIS — J439 Emphysema, unspecified: Secondary | ICD-10-CM | POA: Diagnosis not present

## 2019-07-22 DIAGNOSIS — M059 Rheumatoid arthritis with rheumatoid factor, unspecified: Secondary | ICD-10-CM | POA: Diagnosis not present

## 2019-07-22 DIAGNOSIS — M0579 Rheumatoid arthritis with rheumatoid factor of multiple sites without organ or systems involvement: Secondary | ICD-10-CM | POA: Diagnosis not present

## 2019-07-22 DIAGNOSIS — N183 Chronic kidney disease, stage 3 (moderate): Secondary | ICD-10-CM | POA: Diagnosis not present

## 2019-07-22 DIAGNOSIS — Z79899 Other long term (current) drug therapy: Secondary | ICD-10-CM | POA: Diagnosis not present

## 2019-08-02 DIAGNOSIS — J449 Chronic obstructive pulmonary disease, unspecified: Secondary | ICD-10-CM | POA: Diagnosis not present

## 2019-08-19 DIAGNOSIS — M0579 Rheumatoid arthritis with rheumatoid factor of multiple sites without organ or systems involvement: Secondary | ICD-10-CM | POA: Diagnosis not present

## 2019-08-23 DIAGNOSIS — Z79899 Other long term (current) drug therapy: Secondary | ICD-10-CM | POA: Diagnosis not present

## 2019-08-23 DIAGNOSIS — D508 Other iron deficiency anemias: Secondary | ICD-10-CM | POA: Diagnosis not present

## 2019-08-23 DIAGNOSIS — I10 Essential (primary) hypertension: Secondary | ICD-10-CM | POA: Diagnosis not present

## 2019-08-23 DIAGNOSIS — M059 Rheumatoid arthritis with rheumatoid factor, unspecified: Secondary | ICD-10-CM | POA: Diagnosis not present

## 2019-08-23 DIAGNOSIS — N183 Chronic kidney disease, stage 3 (moderate): Secondary | ICD-10-CM | POA: Diagnosis not present

## 2019-08-23 DIAGNOSIS — Z136 Encounter for screening for cardiovascular disorders: Secondary | ICD-10-CM | POA: Diagnosis not present

## 2019-08-28 ENCOUNTER — Telehealth: Payer: Self-pay

## 2019-08-28 NOTE — Telephone Encounter (Signed)
Pre-visit assessment call attempted prior to [place] appointment with Dr. Rogue Bussing on 08/29/2019. Pt reports that she needs to cancel the appointment. Care team notified.

## 2019-08-29 ENCOUNTER — Inpatient Hospital Stay: Payer: Medicare HMO | Admitting: Internal Medicine

## 2019-08-29 ENCOUNTER — Inpatient Hospital Stay: Payer: Medicare HMO

## 2019-09-01 DIAGNOSIS — J449 Chronic obstructive pulmonary disease, unspecified: Secondary | ICD-10-CM | POA: Diagnosis not present

## 2019-09-02 DIAGNOSIS — Z87891 Personal history of nicotine dependence: Secondary | ICD-10-CM | POA: Diagnosis not present

## 2019-09-02 DIAGNOSIS — D509 Iron deficiency anemia, unspecified: Secondary | ICD-10-CM | POA: Diagnosis not present

## 2019-09-02 DIAGNOSIS — I10 Essential (primary) hypertension: Secondary | ICD-10-CM | POA: Diagnosis not present

## 2019-09-02 DIAGNOSIS — Z0000 Encounter for general adult medical examination without abnormal findings: Secondary | ICD-10-CM | POA: Diagnosis not present

## 2019-09-16 DIAGNOSIS — M0579 Rheumatoid arthritis with rheumatoid factor of multiple sites without organ or systems involvement: Secondary | ICD-10-CM | POA: Diagnosis not present

## 2019-10-02 DIAGNOSIS — J449 Chronic obstructive pulmonary disease, unspecified: Secondary | ICD-10-CM | POA: Diagnosis not present

## 2019-10-21 DIAGNOSIS — M0579 Rheumatoid arthritis with rheumatoid factor of multiple sites without organ or systems involvement: Secondary | ICD-10-CM | POA: Diagnosis not present

## 2019-11-01 DIAGNOSIS — J449 Chronic obstructive pulmonary disease, unspecified: Secondary | ICD-10-CM | POA: Diagnosis not present

## 2019-11-18 DIAGNOSIS — M0579 Rheumatoid arthritis with rheumatoid factor of multiple sites without organ or systems involvement: Secondary | ICD-10-CM | POA: Diagnosis not present

## 2019-12-02 DIAGNOSIS — J449 Chronic obstructive pulmonary disease, unspecified: Secondary | ICD-10-CM | POA: Diagnosis not present

## 2019-12-16 DIAGNOSIS — Z9981 Dependence on supplemental oxygen: Secondary | ICD-10-CM | POA: Diagnosis not present

## 2019-12-16 DIAGNOSIS — Z79899 Other long term (current) drug therapy: Secondary | ICD-10-CM | POA: Diagnosis not present

## 2019-12-16 DIAGNOSIS — J439 Emphysema, unspecified: Secondary | ICD-10-CM | POA: Diagnosis not present

## 2019-12-16 DIAGNOSIS — J449 Chronic obstructive pulmonary disease, unspecified: Secondary | ICD-10-CM | POA: Diagnosis not present

## 2019-12-16 DIAGNOSIS — R0602 Shortness of breath: Secondary | ICD-10-CM | POA: Diagnosis not present

## 2019-12-16 DIAGNOSIS — R05 Cough: Secondary | ICD-10-CM | POA: Diagnosis not present

## 2019-12-16 DIAGNOSIS — N1832 Chronic kidney disease, stage 3b: Secondary | ICD-10-CM | POA: Diagnosis not present

## 2019-12-16 DIAGNOSIS — M059 Rheumatoid arthritis with rheumatoid factor, unspecified: Secondary | ICD-10-CM | POA: Diagnosis not present

## 2019-12-24 DIAGNOSIS — M0579 Rheumatoid arthritis with rheumatoid factor of multiple sites without organ or systems involvement: Secondary | ICD-10-CM | POA: Diagnosis not present

## 2020-01-02 DIAGNOSIS — J449 Chronic obstructive pulmonary disease, unspecified: Secondary | ICD-10-CM | POA: Diagnosis not present

## 2020-01-20 DIAGNOSIS — J449 Chronic obstructive pulmonary disease, unspecified: Secondary | ICD-10-CM | POA: Diagnosis not present

## 2020-01-20 DIAGNOSIS — R0600 Dyspnea, unspecified: Secondary | ICD-10-CM | POA: Diagnosis not present

## 2020-01-20 DIAGNOSIS — Z9981 Dependence on supplemental oxygen: Secondary | ICD-10-CM | POA: Diagnosis not present

## 2020-01-21 DIAGNOSIS — M0579 Rheumatoid arthritis with rheumatoid factor of multiple sites without organ or systems involvement: Secondary | ICD-10-CM | POA: Diagnosis not present

## 2020-01-30 DIAGNOSIS — J449 Chronic obstructive pulmonary disease, unspecified: Secondary | ICD-10-CM | POA: Diagnosis not present

## 2020-02-18 DIAGNOSIS — M0579 Rheumatoid arthritis with rheumatoid factor of multiple sites without organ or systems involvement: Secondary | ICD-10-CM | POA: Diagnosis not present

## 2020-02-24 DIAGNOSIS — D508 Other iron deficiency anemias: Secondary | ICD-10-CM | POA: Diagnosis not present

## 2020-02-24 DIAGNOSIS — I10 Essential (primary) hypertension: Secondary | ICD-10-CM | POA: Diagnosis not present

## 2020-02-24 DIAGNOSIS — M0579 Rheumatoid arthritis with rheumatoid factor of multiple sites without organ or systems involvement: Secondary | ICD-10-CM | POA: Diagnosis not present

## 2020-02-24 DIAGNOSIS — N1832 Chronic kidney disease, stage 3b: Secondary | ICD-10-CM | POA: Diagnosis not present

## 2020-02-24 DIAGNOSIS — Z79899 Other long term (current) drug therapy: Secondary | ICD-10-CM | POA: Diagnosis not present

## 2020-02-27 ENCOUNTER — Other Ambulatory Visit: Payer: Self-pay | Admitting: Family Medicine

## 2020-02-27 DIAGNOSIS — K219 Gastro-esophageal reflux disease without esophagitis: Secondary | ICD-10-CM | POA: Diagnosis not present

## 2020-02-27 DIAGNOSIS — N1832 Chronic kidney disease, stage 3b: Secondary | ICD-10-CM | POA: Diagnosis not present

## 2020-02-27 DIAGNOSIS — Z87891 Personal history of nicotine dependence: Secondary | ICD-10-CM | POA: Diagnosis not present

## 2020-02-27 DIAGNOSIS — I129 Hypertensive chronic kidney disease with stage 1 through stage 4 chronic kidney disease, or unspecified chronic kidney disease: Secondary | ICD-10-CM | POA: Diagnosis not present

## 2020-02-27 DIAGNOSIS — R1310 Dysphagia, unspecified: Secondary | ICD-10-CM

## 2020-02-27 DIAGNOSIS — D508 Other iron deficiency anemias: Secondary | ICD-10-CM | POA: Diagnosis not present

## 2020-03-01 DIAGNOSIS — J449 Chronic obstructive pulmonary disease, unspecified: Secondary | ICD-10-CM | POA: Diagnosis not present

## 2020-03-11 ENCOUNTER — Other Ambulatory Visit: Payer: Self-pay

## 2020-03-11 ENCOUNTER — Ambulatory Visit
Admission: RE | Admit: 2020-03-11 | Discharge: 2020-03-11 | Disposition: A | Discharge status: Home / Self Care | Payer: Medicare HMO | Source: Ambulatory Visit | Attending: Family Medicine | Admitting: Family Medicine

## 2020-03-11 DIAGNOSIS — R1312 Dysphagia, oropharyngeal phase: Secondary | ICD-10-CM

## 2020-03-11 NOTE — Therapy (Signed)
[place], [place], [address] Phone: [phone]   Fax:     Modified Barium Swallow  Patient Details  Name: NASHIKA MUSSEN MRN: [id number] Date of Birth: Jan 25, 1946 No data recorded  Encounter Date: 03/11/2020  End of Session - 03/11/20 1311    Visit Number  1    Number of Visits  1    Date for SLP Re-Evaluation  03/11/20    SLP Start Time  [id number]    SLP Stop Time   1230    SLP Time Calculation (min)  45 min    Activity Tolerance  Patient tolerated treatment well       Past Medical History:  Diagnosis Date  . Anemia   . Arthritis   . COPD (chronic obstructive pulmonary disease) ([place])   . Hypertension     Past Surgical History:  Procedure Laterality Date  . COLONOSCOPY WITH PROPOFOL N/A 09/28/2016   Procedure: COLONOSCOPY WITH PROPOFOL;  Surgeon: Manya Silvas, MD;  Location: [facility] ENDOSCOPY;  Service: Endoscopy;  Laterality: N/A;  . ESOPHAGOGASTRODUODENOSCOPY (EGD) WITH PROPOFOL N/A 09/28/2016   Procedure: ESOPHAGOGASTRODUODENOSCOPY (EGD) WITH PROPOFOL;  Surgeon: Manya Silvas, MD;  Location: [facility] ENDOSCOPY;  Service: Endoscopy;  Laterality: N/A;    There were no vitals filed for this visit.     Subjective: Patient behavior: (alertness, ability to follow instructions, etc.): The patient is alert, able to verbalize her swallowing complaints, and follow directions.  Chief complaint: Food gets caught (indicates mid-sternum) with pain at times   Objective:  Radiological Procedure: A videoflouroscopic evaluation of oral-preparatory, reflex initiation, and pharyngeal phases of the swallow was performed; as well as a screening of the upper esophageal phase.  I. POSTURE: Upright in MBS chair  II. VIEW: Lateral  III. COMPENSATORY STRATEGIES: N/A  IV. BOLUSES ADMINISTERED:   Thin Liquid: 1 small, 3 rapid consecutive   Nectar-thick Liquid: 1 moderate   Honey-thick Liquid:  DNT   Puree: 1 teaspoon, 1 heaping teaspoon   Mechanical Soft: 1/4 graham cracker in applesauce  V. RESULTS OF EVALUATION: A. ORAL PREPARATORY PHASE: (The lips, tongue, and velum are observed for strength and coordination)       **Overall Severity Rating: within normal limits   B. SWALLOW INITIATION/REFLEX: (The reflex is normal if "triggered" by the time the bolus reached the base of the tongue)  **Overall Severity Rating: Mild; triggers while falling from the valleculae to the pyriform sinuses   C. PHARYNGEAL PHASE: (Pharyngeal function is normal if the bolus shows rapid, smooth, and continuous transit through the pharynx and there is no pharyngeal residue after the swallow)  **Overall Severity Rating: within normal limits   D. LARYNGEAL PENETRATION: (Material entering into the laryngeal inlet/vestibule but not aspirated) None  E. ASPIRATION: None  F. ESOPHAGEAL PHASE: (Screening of the upper esophagus): No observed abnormality within the viewable cervical esophagus.  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   ASSESSMENT: This 74 year old woman; with difficulty swallowing ("food gets caught"); is presenting with minimal oropharyngeal dysphagia characterized by delayed pharyngeal swallow initiation.  Oral control of the bolus including oral hold, rotary mastication, and anterior to posterior transfer is within functional limits.   Aspects of the pharyngeal stage of swallowing including tongue base retraction, hyolaryngeal excursion, epiglottic inversion, and duration/amplitude of UES opening are within normal limits.  There is no observed pharyngeal residue, laryngeal penetration, or tracheal aspiration.  The patient is not at risk for prandial aspiration.  Delayed pharyngeal swallow initiation is consistent with effects of laryngopharyngeal reflux (inflammation, edema, and resultant decreased sensation of the larynx and pharynx).  The patient's symptoms appear to be  related to esophageal function, recommend referral to GI.  PLAN/RECOMMENDATIONS:   A. Diet: Regular   B. Swallowing Precautions: Continue to drink plenty of fluids with meals   C. Recommended consultation to: GI   D. Therapy recommendations: Speech therapy is not indicated   E. Results and recommendations were discussed with the patient immediately after the study and the final report routed to the referring MD.    Oropharyngeal dysphagia - Plan: DG SWALLOW FUNC OP MEDICARE SPEECH PATH, DG SWALLOW FUNC OP MEDICARE SPEECH PATH        Problem List Patient Active Problem List   Diagnosis Date Noted  . Microcytic anemia 05/14/2019   Leroy Sea, MS/CCC- SLP  Lou Miner 03/11/2020, 1:13 PM  [facility] [place], [place], [address] Phone: [phone]   Fax:     Name: VANDY REUTHER MRN: [id number] Date of Birth: 09-12-1946

## 2020-03-17 DIAGNOSIS — M0579 Rheumatoid arthritis with rheumatoid factor of multiple sites without organ or systems involvement: Secondary | ICD-10-CM | POA: Diagnosis not present

## 2020-03-24 DIAGNOSIS — M059 Rheumatoid arthritis with rheumatoid factor, unspecified: Secondary | ICD-10-CM | POA: Diagnosis not present

## 2020-03-24 DIAGNOSIS — J439 Emphysema, unspecified: Secondary | ICD-10-CM | POA: Diagnosis not present

## 2020-03-24 DIAGNOSIS — Z87891 Personal history of nicotine dependence: Secondary | ICD-10-CM | POA: Diagnosis not present

## 2020-03-31 DIAGNOSIS — J449 Chronic obstructive pulmonary disease, unspecified: Secondary | ICD-10-CM | POA: Diagnosis not present

## 2020-04-14 DIAGNOSIS — M0579 Rheumatoid arthritis with rheumatoid factor of multiple sites without organ or systems involvement: Secondary | ICD-10-CM | POA: Diagnosis not present

## 2020-04-14 DIAGNOSIS — M059 Rheumatoid arthritis with rheumatoid factor, unspecified: Secondary | ICD-10-CM | POA: Diagnosis not present

## 2020-04-14 DIAGNOSIS — N1832 Chronic kidney disease, stage 3b: Secondary | ICD-10-CM | POA: Diagnosis not present

## 2020-04-14 DIAGNOSIS — Z79899 Other long term (current) drug therapy: Secondary | ICD-10-CM | POA: Diagnosis not present

## 2020-05-01 DIAGNOSIS — J449 Chronic obstructive pulmonary disease, unspecified: Secondary | ICD-10-CM | POA: Diagnosis not present

## 2020-05-12 DIAGNOSIS — M0579 Rheumatoid arthritis with rheumatoid factor of multiple sites without organ or systems involvement: Secondary | ICD-10-CM | POA: Diagnosis not present

## 2020-05-12 DIAGNOSIS — Z79899 Other long term (current) drug therapy: Secondary | ICD-10-CM | POA: Diagnosis not present

## 2020-05-12 DIAGNOSIS — M059 Rheumatoid arthritis with rheumatoid factor, unspecified: Secondary | ICD-10-CM | POA: Diagnosis not present

## 2020-05-18 DIAGNOSIS — J439 Emphysema, unspecified: Secondary | ICD-10-CM | POA: Diagnosis not present

## 2020-05-18 DIAGNOSIS — R0600 Dyspnea, unspecified: Secondary | ICD-10-CM | POA: Diagnosis not present

## 2020-05-19 DIAGNOSIS — D649 Anemia, unspecified: Secondary | ICD-10-CM | POA: Diagnosis not present

## 2020-05-19 DIAGNOSIS — R1310 Dysphagia, unspecified: Secondary | ICD-10-CM | POA: Diagnosis not present

## 2020-05-20 ENCOUNTER — Other Ambulatory Visit: Payer: Self-pay | Admitting: Student

## 2020-05-20 DIAGNOSIS — R1319 Other dysphagia: Secondary | ICD-10-CM

## 2020-05-25 ENCOUNTER — Other Ambulatory Visit: Payer: Self-pay

## 2020-05-25 ENCOUNTER — Ambulatory Visit
Admission: RE | Admit: 2020-05-25 | Discharge: 2020-05-25 | Disposition: A | Discharge status: Home / Self Care | Payer: Medicare HMO | Source: Ambulatory Visit | Attending: Student | Admitting: Student

## 2020-05-25 DIAGNOSIS — R1310 Dysphagia, unspecified: Secondary | ICD-10-CM | POA: Insufficient documentation

## 2020-05-25 DIAGNOSIS — R1319 Other dysphagia: Secondary | ICD-10-CM

## 2020-05-25 DIAGNOSIS — K219 Gastro-esophageal reflux disease without esophagitis: Secondary | ICD-10-CM | POA: Diagnosis not present

## 2020-05-31 DIAGNOSIS — J449 Chronic obstructive pulmonary disease, unspecified: Secondary | ICD-10-CM | POA: Diagnosis not present

## 2020-06-09 DIAGNOSIS — M0579 Rheumatoid arthritis with rheumatoid factor of multiple sites without organ or systems involvement: Secondary | ICD-10-CM | POA: Diagnosis not present

## 2020-06-30 DIAGNOSIS — I10 Essential (primary) hypertension: Secondary | ICD-10-CM | POA: Diagnosis not present

## 2020-06-30 DIAGNOSIS — D508 Other iron deficiency anemias: Secondary | ICD-10-CM | POA: Diagnosis not present

## 2020-07-01 DIAGNOSIS — J449 Chronic obstructive pulmonary disease, unspecified: Secondary | ICD-10-CM | POA: Diagnosis not present

## 2020-07-07 DIAGNOSIS — Z1159 Encounter for screening for other viral diseases: Secondary | ICD-10-CM | POA: Diagnosis not present

## 2020-07-07 DIAGNOSIS — D508 Other iron deficiency anemias: Secondary | ICD-10-CM | POA: Diagnosis not present

## 2020-07-07 DIAGNOSIS — M0579 Rheumatoid arthritis with rheumatoid factor of multiple sites without organ or systems involvement: Secondary | ICD-10-CM | POA: Diagnosis not present

## 2020-07-07 DIAGNOSIS — Z136 Encounter for screening for cardiovascular disorders: Secondary | ICD-10-CM | POA: Diagnosis not present

## 2020-07-07 DIAGNOSIS — N1831 Chronic kidney disease, stage 3a: Secondary | ICD-10-CM | POA: Diagnosis not present

## 2020-07-07 DIAGNOSIS — I129 Hypertensive chronic kidney disease with stage 1 through stage 4 chronic kidney disease, or unspecified chronic kidney disease: Secondary | ICD-10-CM | POA: Diagnosis not present

## 2020-07-07 DIAGNOSIS — Z0000 Encounter for general adult medical examination without abnormal findings: Secondary | ICD-10-CM | POA: Diagnosis not present

## 2020-07-21 ENCOUNTER — Other Ambulatory Visit: Payer: Self-pay | Admitting: Family Medicine

## 2020-07-21 DIAGNOSIS — Z1231 Encounter for screening mammogram for malignant neoplasm of breast: Secondary | ICD-10-CM

## 2020-08-01 DIAGNOSIS — J449 Chronic obstructive pulmonary disease, unspecified: Secondary | ICD-10-CM | POA: Diagnosis not present

## 2020-08-04 DIAGNOSIS — M0579 Rheumatoid arthritis with rheumatoid factor of multiple sites without organ or systems involvement: Secondary | ICD-10-CM | POA: Diagnosis not present

## 2020-08-07 ENCOUNTER — Ambulatory Visit
Admission: RE | Admit: 2020-08-07 | Discharge: 2020-08-07 | Disposition: A | Discharge status: Home / Self Care | Payer: Medicare HMO | Source: Ambulatory Visit | Attending: Family Medicine | Admitting: Family Medicine

## 2020-08-07 DIAGNOSIS — Z1231 Encounter for screening mammogram for malignant neoplasm of breast: Secondary | ICD-10-CM | POA: Diagnosis not present

## 2020-08-13 DIAGNOSIS — M059 Rheumatoid arthritis with rheumatoid factor, unspecified: Secondary | ICD-10-CM | POA: Diagnosis not present

## 2020-08-13 DIAGNOSIS — Z79899 Other long term (current) drug therapy: Secondary | ICD-10-CM | POA: Diagnosis not present

## 2020-08-13 DIAGNOSIS — J439 Emphysema, unspecified: Secondary | ICD-10-CM | POA: Diagnosis not present

## 2020-08-14 DIAGNOSIS — J449 Chronic obstructive pulmonary disease, unspecified: Secondary | ICD-10-CM | POA: Diagnosis not present

## 2020-08-14 DIAGNOSIS — J439 Emphysema, unspecified: Secondary | ICD-10-CM | POA: Diagnosis not present

## 2020-08-17 DIAGNOSIS — J449 Chronic obstructive pulmonary disease, unspecified: Secondary | ICD-10-CM | POA: Diagnosis not present

## 2020-08-17 DIAGNOSIS — J439 Emphysema, unspecified: Secondary | ICD-10-CM | POA: Diagnosis not present

## 2020-08-31 DIAGNOSIS — J449 Chronic obstructive pulmonary disease, unspecified: Secondary | ICD-10-CM | POA: Diagnosis not present

## 2020-09-09 DIAGNOSIS — M0579 Rheumatoid arthritis with rheumatoid factor of multiple sites without organ or systems involvement: Secondary | ICD-10-CM | POA: Diagnosis not present

## 2020-09-10 DIAGNOSIS — J432 Centrilobular emphysema: Secondary | ICD-10-CM | POA: Diagnosis not present

## 2020-09-10 DIAGNOSIS — R0600 Dyspnea, unspecified: Secondary | ICD-10-CM | POA: Diagnosis not present

## 2020-09-10 DIAGNOSIS — Z9981 Dependence on supplemental oxygen: Secondary | ICD-10-CM | POA: Diagnosis not present

## 2020-09-16 DIAGNOSIS — J449 Chronic obstructive pulmonary disease, unspecified: Secondary | ICD-10-CM | POA: Diagnosis not present

## 2020-10-01 DIAGNOSIS — J449 Chronic obstructive pulmonary disease, unspecified: Secondary | ICD-10-CM | POA: Diagnosis not present

## 2020-10-07 DIAGNOSIS — M0579 Rheumatoid arthritis with rheumatoid factor of multiple sites without organ or systems involvement: Secondary | ICD-10-CM | POA: Diagnosis not present

## 2020-10-17 DIAGNOSIS — J449 Chronic obstructive pulmonary disease, unspecified: Secondary | ICD-10-CM | POA: Diagnosis not present

## 2020-10-31 DIAGNOSIS — J449 Chronic obstructive pulmonary disease, unspecified: Secondary | ICD-10-CM | POA: Diagnosis not present

## 2020-11-04 DIAGNOSIS — M0579 Rheumatoid arthritis with rheumatoid factor of multiple sites without organ or systems involvement: Secondary | ICD-10-CM | POA: Diagnosis not present

## 2020-11-04 DIAGNOSIS — J449 Chronic obstructive pulmonary disease, unspecified: Secondary | ICD-10-CM | POA: Diagnosis not present

## 2020-11-16 DIAGNOSIS — J449 Chronic obstructive pulmonary disease, unspecified: Secondary | ICD-10-CM | POA: Diagnosis not present

## 2020-11-18 DIAGNOSIS — J9801 Acute bronchospasm: Secondary | ICD-10-CM | POA: Diagnosis not present

## 2020-11-18 DIAGNOSIS — J449 Chronic obstructive pulmonary disease, unspecified: Secondary | ICD-10-CM | POA: Diagnosis not present

## 2020-11-18 DIAGNOSIS — Z9981 Dependence on supplemental oxygen: Secondary | ICD-10-CM | POA: Diagnosis not present

## 2020-11-18 DIAGNOSIS — R0600 Dyspnea, unspecified: Secondary | ICD-10-CM | POA: Diagnosis not present

## 2020-12-01 DIAGNOSIS — J449 Chronic obstructive pulmonary disease, unspecified: Secondary | ICD-10-CM | POA: Diagnosis not present

## 2020-12-02 DIAGNOSIS — M0579 Rheumatoid arthritis with rheumatoid factor of multiple sites without organ or systems involvement: Secondary | ICD-10-CM | POA: Diagnosis not present

## 2020-12-02 DIAGNOSIS — M059 Rheumatoid arthritis with rheumatoid factor, unspecified: Secondary | ICD-10-CM | POA: Diagnosis not present

## 2020-12-02 DIAGNOSIS — Z79899 Other long term (current) drug therapy: Secondary | ICD-10-CM | POA: Diagnosis not present

## 2020-12-02 DIAGNOSIS — T691XXA Chilblains, initial encounter: Secondary | ICD-10-CM | POA: Diagnosis not present

## 2020-12-17 DIAGNOSIS — J449 Chronic obstructive pulmonary disease, unspecified: Secondary | ICD-10-CM | POA: Diagnosis not present

## 2020-12-30 DIAGNOSIS — M0579 Rheumatoid arthritis with rheumatoid factor of multiple sites without organ or systems involvement: Secondary | ICD-10-CM | POA: Diagnosis not present

## 2021-01-01 DIAGNOSIS — J449 Chronic obstructive pulmonary disease, unspecified: Secondary | ICD-10-CM | POA: Diagnosis not present

## 2021-01-06 DIAGNOSIS — Z1159 Encounter for screening for other viral diseases: Secondary | ICD-10-CM | POA: Diagnosis not present

## 2021-01-06 DIAGNOSIS — D508 Other iron deficiency anemias: Secondary | ICD-10-CM | POA: Diagnosis not present

## 2021-01-06 DIAGNOSIS — I10 Essential (primary) hypertension: Secondary | ICD-10-CM | POA: Diagnosis not present

## 2021-01-06 DIAGNOSIS — Z136 Encounter for screening for cardiovascular disorders: Secondary | ICD-10-CM | POA: Diagnosis not present

## 2021-01-13 DIAGNOSIS — Z0000 Encounter for general adult medical examination without abnormal findings: Secondary | ICD-10-CM | POA: Diagnosis not present

## 2021-01-13 DIAGNOSIS — I10 Essential (primary) hypertension: Secondary | ICD-10-CM | POA: Diagnosis not present

## 2021-01-17 DIAGNOSIS — J449 Chronic obstructive pulmonary disease, unspecified: Secondary | ICD-10-CM | POA: Diagnosis not present

## 2021-01-19 DIAGNOSIS — R0609 Other forms of dyspnea: Secondary | ICD-10-CM | POA: Diagnosis not present

## 2021-01-19 DIAGNOSIS — R0602 Shortness of breath: Secondary | ICD-10-CM | POA: Diagnosis not present

## 2021-01-27 DIAGNOSIS — M059 Rheumatoid arthritis with rheumatoid factor, unspecified: Secondary | ICD-10-CM | POA: Diagnosis not present

## 2021-01-29 DIAGNOSIS — J449 Chronic obstructive pulmonary disease, unspecified: Secondary | ICD-10-CM | POA: Diagnosis not present

## 2021-02-14 DIAGNOSIS — J449 Chronic obstructive pulmonary disease, unspecified: Secondary | ICD-10-CM | POA: Diagnosis not present

## 2021-02-24 DIAGNOSIS — M059 Rheumatoid arthritis with rheumatoid factor, unspecified: Secondary | ICD-10-CM | POA: Diagnosis not present

## 2021-03-01 DIAGNOSIS — J449 Chronic obstructive pulmonary disease, unspecified: Secondary | ICD-10-CM | POA: Diagnosis not present

## 2021-03-16 DIAGNOSIS — R0600 Dyspnea, unspecified: Secondary | ICD-10-CM | POA: Diagnosis not present

## 2021-03-16 DIAGNOSIS — Z9981 Dependence on supplemental oxygen: Secondary | ICD-10-CM | POA: Diagnosis not present

## 2021-03-16 DIAGNOSIS — J449 Chronic obstructive pulmonary disease, unspecified: Secondary | ICD-10-CM | POA: Diagnosis not present

## 2021-03-17 DIAGNOSIS — J449 Chronic obstructive pulmonary disease, unspecified: Secondary | ICD-10-CM | POA: Diagnosis not present

## 2021-03-24 DIAGNOSIS — M059 Rheumatoid arthritis with rheumatoid factor, unspecified: Secondary | ICD-10-CM | POA: Diagnosis not present

## 2022-03-27 IMAGING — MG DIGITAL SCREENING BILAT W/ TOMO W/ CAD
8 series · 8 of 24 positions shown · non-contrast
Comparison: Previous exam(s).

CLINICAL DATA: Screening.

EXAM:
DIGITAL SCREENING BILATERAL MAMMOGRAM WITH TOMO AND CAD

[L CC synth-2D]
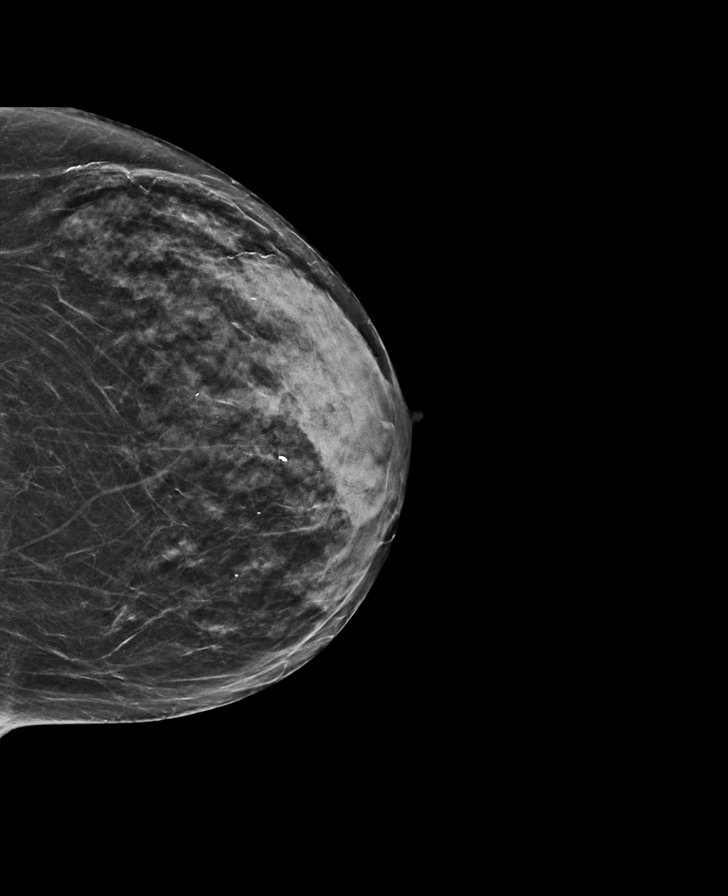

[R CC synth-2D]
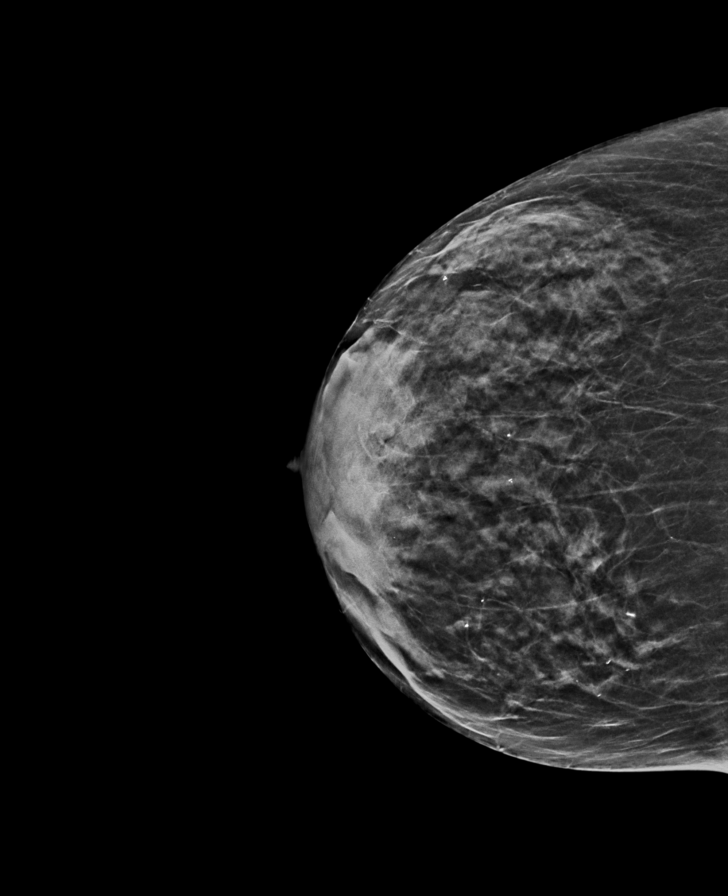

[R MLO synth-2D]
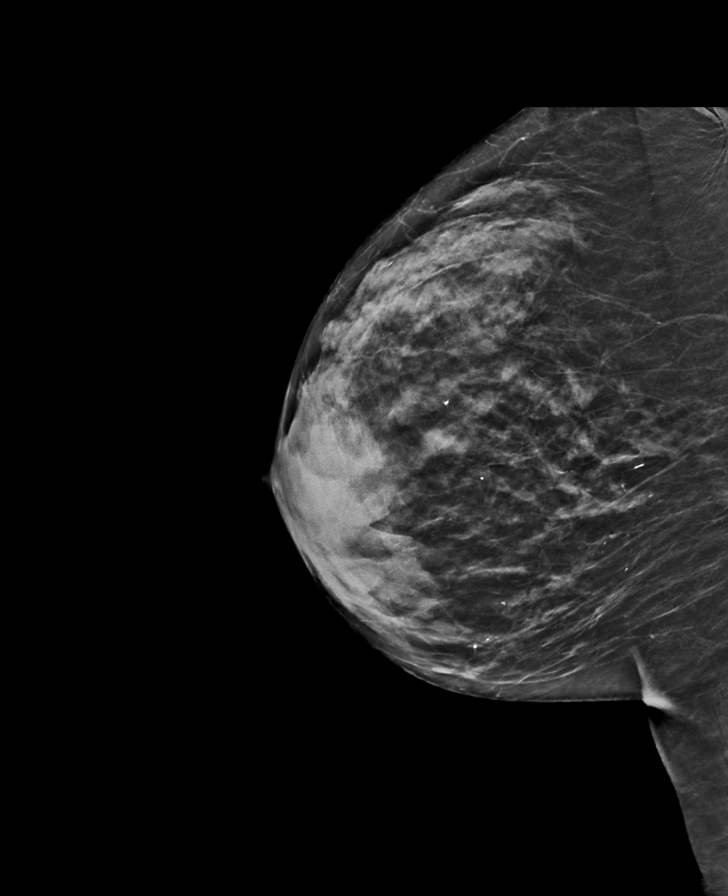

[L MLO synth-2D]
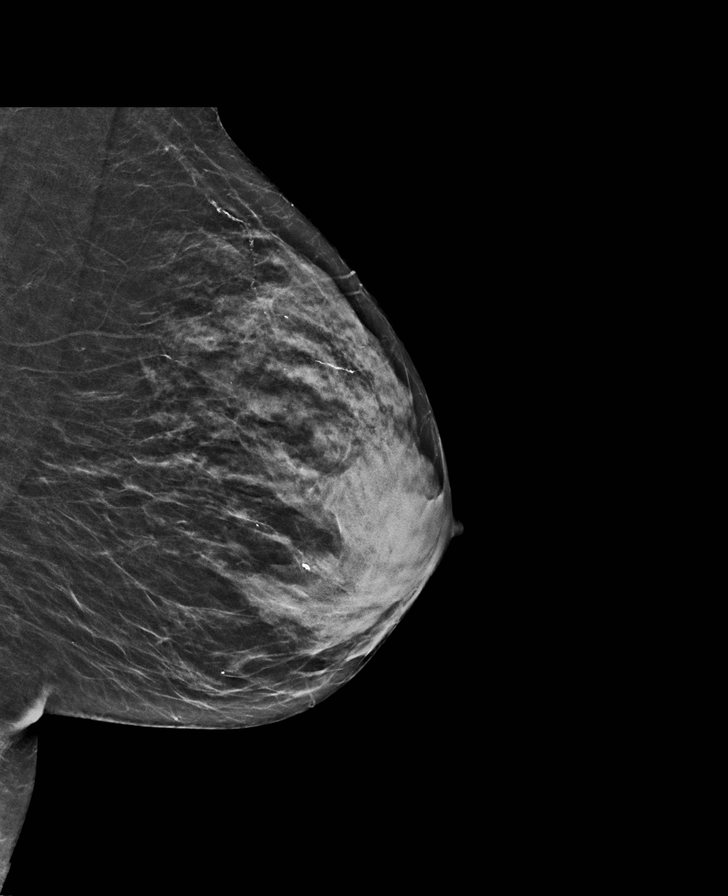

[L MLO tomo · tomo slice 26/51.0]
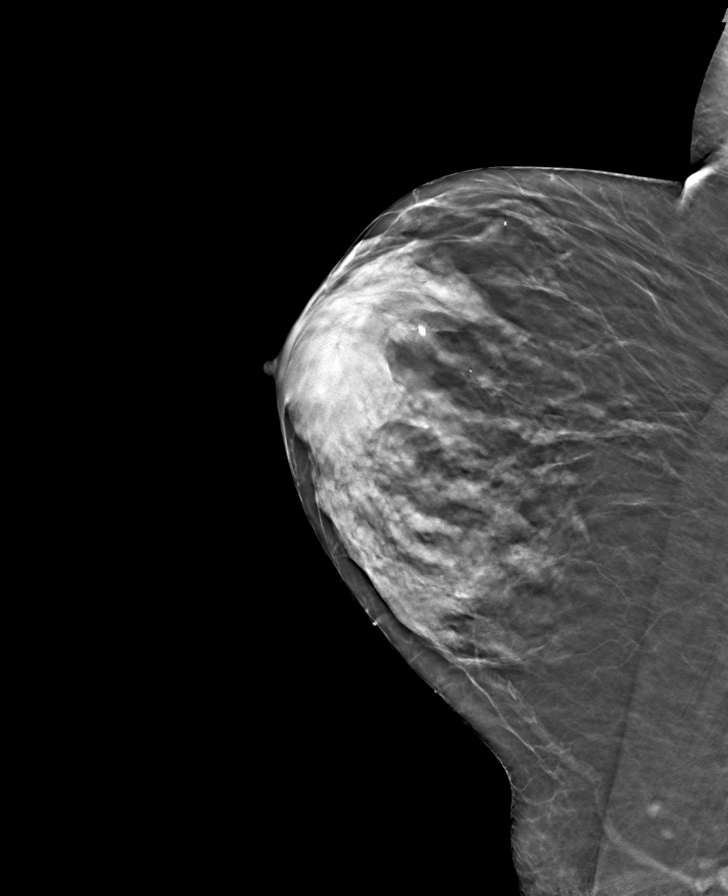

[R MLO tomo · tomo slice 27/52.0]
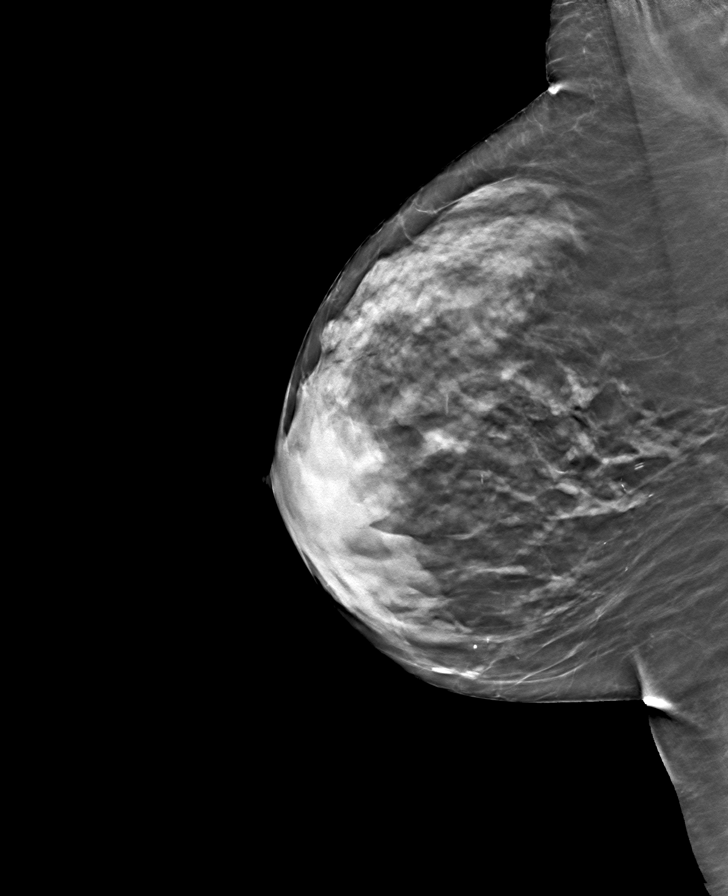

[R CC tomo · tomo slice 25/50.0]
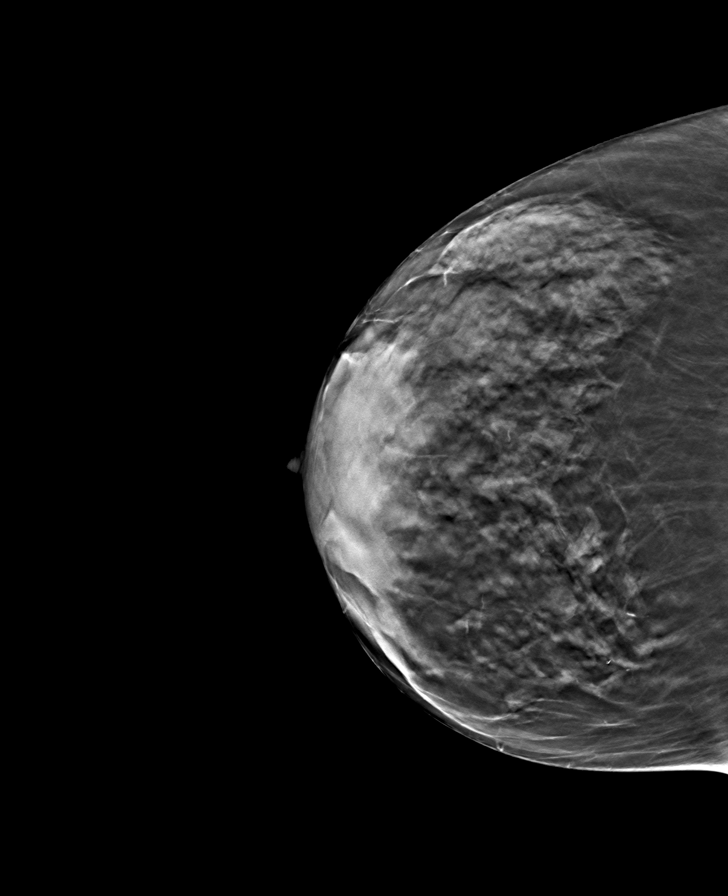

[L CC tomo · tomo slice 27/52.0]
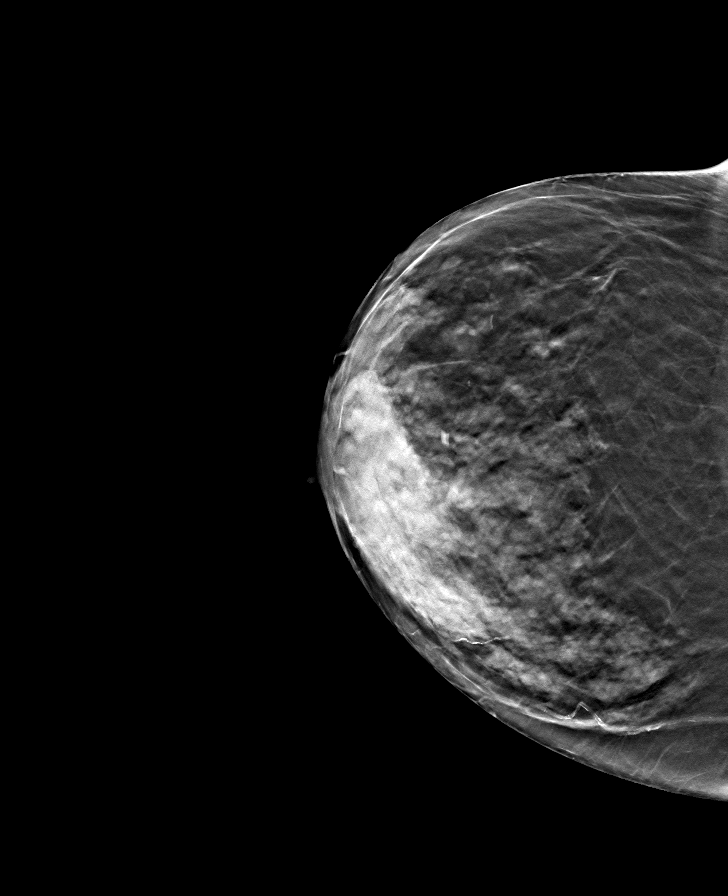

[8 of 24 positions shown; findings below may reference images not displayed]

ACR Breast Density Category c: The breast tissue is heterogeneously
dense, which may obscure small masses.
FINDINGS: There are no findings suspicious for malignancy. Images were
processed with CAD.
IMPRESSION: No mammographic evidence of malignancy. A result letter of this
screening mammogram will be mailed directly to the patient.

RECOMMENDATION:
Screening mammogram in one year. (Code:[id number]-[id number])

BI-RADS CATEGORY  1: Negative.

## 2022-12-02 ENCOUNTER — Encounter: Payer: Self-pay | Admitting: Internal Medicine

## 2022-12-02 ENCOUNTER — Inpatient Hospital Stay: Payer: Medicare Other | Attending: Internal Medicine | Admitting: Internal Medicine

## 2022-12-02 ENCOUNTER — Inpatient Hospital Stay: Payer: Medicare Other

## 2022-12-02 VITALS — BP 133/86 | HR 88 | Temp 97.6°F | Resp 19 | Wt 127.3 lb

## 2022-12-02 DIAGNOSIS — I129 Hypertensive chronic kidney disease with stage 1 through stage 4 chronic kidney disease, or unspecified chronic kidney disease: Secondary | ICD-10-CM | POA: Diagnosis not present

## 2022-12-02 DIAGNOSIS — J449 Chronic obstructive pulmonary disease, unspecified: Secondary | ICD-10-CM | POA: Diagnosis not present

## 2022-12-02 DIAGNOSIS — M069 Rheumatoid arthritis, unspecified: Secondary | ICD-10-CM | POA: Insufficient documentation

## 2022-12-02 DIAGNOSIS — R718 Other abnormality of red blood cells: Secondary | ICD-10-CM

## 2022-12-02 DIAGNOSIS — N1831 Chronic kidney disease, stage 3a: Secondary | ICD-10-CM | POA: Insufficient documentation

## 2022-12-02 DIAGNOSIS — D649 Anemia, unspecified: Secondary | ICD-10-CM

## 2022-12-02 DIAGNOSIS — Z803 Family history of malignant neoplasm of breast: Secondary | ICD-10-CM | POA: Diagnosis not present

## 2022-12-02 DIAGNOSIS — N183 Chronic kidney disease, stage 3 unspecified: Secondary | ICD-10-CM | POA: Insufficient documentation

## 2022-12-02 DIAGNOSIS — D509 Iron deficiency anemia, unspecified: Secondary | ICD-10-CM | POA: Diagnosis not present

## 2022-12-02 DIAGNOSIS — Z87891 Personal history of nicotine dependence: Secondary | ICD-10-CM

## 2022-12-02 DIAGNOSIS — Z9981 Dependence on supplemental oxygen: Secondary | ICD-10-CM | POA: Diagnosis not present

## 2022-12-02 DIAGNOSIS — D638 Anemia in other chronic diseases classified elsewhere: Secondary | ICD-10-CM | POA: Insufficient documentation

## 2022-12-02 LAB — CBC WITH DIFFERENTIAL/PLATELET
Abs Immature Granulocytes: 0.07 10*3/uL (ref 0.00–0.07)
Basophils Absolute: 0 10*3/uL (ref 0.0–0.1)
Basophils Relative: 0 %
Eosinophils Absolute: 0.1 10*3/uL (ref 0.0–0.5)
Eosinophils Relative: 1 %
HCT: 31.7 % — ABNORMAL LOW (ref 36.0–46.0)
Hemoglobin: 9.4 g/dL — ABNORMAL LOW (ref 12.0–15.0)
Immature Granulocytes: 1 %
Lymphocytes Relative: 5 %
Lymphs Abs: 0.5 10*3/uL — ABNORMAL LOW (ref 0.7–4.0)
MCH: 22.2 pg — ABNORMAL LOW (ref 26.0–34.0)
MCHC: 29.7 g/dL — ABNORMAL LOW (ref 30.0–36.0)
MCV: 74.9 fL — ABNORMAL LOW (ref 80.0–100.0)
Monocytes Absolute: 0.5 10*3/uL (ref 0.1–1.0)
Monocytes Relative: 5 %
Neutro Abs: 8.9 10*3/uL — ABNORMAL HIGH (ref 1.7–7.7)
Neutrophils Relative %: 88 %
Platelets: 162 10*3/uL (ref 150–400)
RBC: 4.23 MIL/uL (ref 3.87–5.11)
RDW: 17.2 % — ABNORMAL HIGH (ref 11.5–15.5)
WBC: 10 10*3/uL (ref 4.0–10.5)
nRBC: 0 % (ref 0.0–0.2)

## 2022-12-02 LAB — IRON AND TIBC
Iron: 102 ug/dL (ref 28–170)
Saturation Ratios: 39 % — ABNORMAL HIGH (ref 10.4–31.8)
TIBC: 262 ug/dL (ref 250–450)
UIBC: 160 ug/dL

## 2022-12-02 LAB — FERRITIN: Ferritin: 1163 ng/mL — ABNORMAL HIGH (ref 11–307)

## 2022-12-02 LAB — VITAMIN B12: Vitamin B-12: 395 pg/mL (ref 180–914)

## 2022-12-02 LAB — FOLATE: Folate: 14 ng/mL (ref >5.9)

## 2022-12-02 NOTE — Progress Notes (Signed)
[facility] [facility]  [phone] [phone]  ID: Darl Householder OB: [birth year]  MR#: [id number]  FYB#:[id number]  Patient Care Team: Dion Body, MD as PCP - General (Family Medicine)  REFERRING PROVIDER: Dr. Netty Starring  REASON FOR REFERRAL: microcytic anemia  HPI: Joan Parker is a 77 y.o. female with past medical history of anemia, COPD, hypertension, CKD stage IIIa, rheumatoid arthritis was referred to hematology for microcytic anemia  Patient reports feeling well.  She is on 2 L oxygen at baseline.  Denies any worsening shortness of breath or chest pain.  She follows with Dr. Posey Pronto for rheumatoid arthritis and is on Orencia infusions.  Patient has a history of chronic anemia at least since 2017.  Hemoglobin has been ranging between 9-10.  MCV low around 75.  WBC and platelets are normal.  She also has history of CKD stage IIIa  Most recent labs from 11/18/2022 showed hemoglobin 9.5, MCV 75.  Creatinine 1.3.  GFR 42.  ESR elevated 34.   REVIEW OF SYSTEMS:   ROS  As per HPI. Otherwise, a complete review of systems is negative.  PAST MEDICAL HISTORY: Past Medical History:  Diagnosis Date   Anemia    Arthritis    COPD (chronic obstructive pulmonary disease) ([place])    Hypertension     PAST SURGICAL HISTORY: Past Surgical History:  Procedure Laterality Date   COLONOSCOPY WITH PROPOFOL N/A 09/28/2016   Procedure: COLONOSCOPY WITH PROPOFOL;  Surgeon: Manya Silvas, MD;  Location: [place];  Service: Endoscopy;  Laterality: N/A;   ESOPHAGOGASTRODUODENOSCOPY (EGD) WITH PROPOFOL N/A 09/28/2016   Procedure: ESOPHAGOGASTRODUODENOSCOPY (EGD) WITH PROPOFOL;  Surgeon: Manya Silvas, MD;  [facility] ENDOSCOPY;  Service: Endoscopy;  Laterality: N/A;    FAMILY HISTORY: Family History  Problem Relation Age of Onset   Diabetes Father    Breast cancer Maternal Aunt    Breast cancer Paternal Aunt     HEALTH MAINTENANCE: Social  History   Tobacco Use   Smoking status: Former    Packs/day: 1.00    Years: 15.00    Total pack years: 15.00    Types: Cigarettes   Smokeless tobacco: Never  Substance Use Topics   Alcohol use: No   Drug use: No     No Known Allergies  Current Outpatient Medications  Medication Sig Dispense Refill   albuterol (VENTOLIN HFA) 108 (90 Base) MCG/ACT inhaler Inhale 2 Inhalers into the lungs every 4 (four) hours as needed for shortness of breath.     alendronate (FOSAMAX) 70 MG tablet Take 70 mg by mouth once a week. Take with a full glass of water on an empty stomach.     amLODipine (NORVASC) 10 MG tablet Take 1 tablet by mouth daily.     budesonide-formoterol (SYMBICORT) 160-4.5 MCG/ACT inhaler Inhale 2 puffs into the lungs 2 (two) times daily.     CALCIUM 600-D 600-400 MG-UNIT TABS Take 1 tablet by mouth 2 (two) times daily with a meal.     lisinopril (PRINIVIL,ZESTRIL) 40 MG tablet Take 40 mg by mouth daily.     omeprazole (PRILOSEC) 20 MG capsule Take 1 capsule by mouth daily.     OXYGEN Inhale 2 L/min into the lungs as needed (shortness of breath).     predniSONE (DELTASONE) 10 MG tablet Take by mouth.     tiotropium (SPIRIVA HANDIHALER) 18 MCG inhalation capsule Take 1 capsule by mouth daily.     ergocalciferol (VITAMIN D2) 50000 units capsule Take 50,000 Units  by mouth once a week. (Patient not taking: Reported on 12/02/2022)     No current facility-administered medications for this visit.    OBJECTIVE: Vitals:   12/02/22 1323  BP: 133/86  Pulse: 88  Resp: 19  Temp: 97.6 F (36.4 C)  SpO2: 99%     Body mass index is 24.05 kg/m.      General: Well-developed, well-nourished, no acute distress. Eyes: Pink conjunctiva, anicteric sclera. HEENT: Normocephalic, moist mucous membranes, clear oropharnyx. Lungs: Clear to auscultation bilaterally. Heart: Regular rate and rhythm. No rubs, murmurs, or gallops. Abdomen: Soft, nontender, nondistended. No organomegaly noted,  normoactive bowel sounds. Musculoskeletal: No edema, cyanosis, or clubbing. Neuro: Alert, answering all questions appropriately. Cranial nerves grossly intact. Skin: No rashes or petechiae noted. Psych: Normal affect. Lymphatics: No cervical, calvicular, axillary or inguinal LAD.   LAB RESULTS:  Lab Results  Component Value Date   NA 142 05/14/2019   K 4.3 05/14/2019   CL 104 05/14/2019   CO2 30 05/14/2019   GLUCOSE 90 05/14/2019   BUN 34 (H) 05/14/2019   CREATININE 1.48 (H) 05/14/2019   CALCIUM 9.6 05/14/2019   PROT 7.5 05/14/2019   ALBUMIN 4.2 05/14/2019   AST 22 05/14/2019   ALT 13 05/14/2019   ALKPHOS 48 05/14/2019   BILITOT 0.3 05/14/2019   GFRNONAA 35 (L) 05/14/2019   GFRAA 40 (L) 05/14/2019    Lab Results  Component Value Date   WBC 7.3 05/14/2019   NEUTROABS 5.3 05/14/2019   HGB 9.8 (L) 05/14/2019   HCT 33.6 (L) 05/14/2019   MCV 76.5 (L) 05/14/2019   PLT 201 05/14/2019    No results found for: "TIBC", "FERRITIN", "IRONPCTSAT"   STUDIES: No results found.  ASSESSMENT AND PLAN:   Joan Parker is a 77 y.o. female with pmh of anemia, COPD, hypertension, CKD stage IIIa, rheumatoid arthritis was referred to hematology for microcytic anemia.  # Microcytic anemia # CKD stage III -Unclear etiology. -Patient has a history of chronic anemia at least since 2017.  Hemoglobin has been ranging between 9-10.  MCV low around 75.  WBC and platelets are normal.  On prior lab work, patient has had elevated ferritin level and ESR which could be due to inflammatory state from rheumatoid arthritis.  She has also been on oral iron with no improvement in hemoglobin.  I will order the labs as below to assess for etiology.  It is possible that her anemia could be from chronic disease such as CKD and rheumatoid arthritis.  Usually anemia of chronic disease and normocytic in nature.  She has microcytic anemia.  There may be a component of thalassemia.  I will check for hemoglobin  electrophoresis to start with.  If her hemoglobin continues to trend down, she may benefit from Aranesp injections to maintain a goal level of 9-10.  Side effects of Aranesp injections such as hypertension, increased risk of stroke or thromboembolic event with hemoglobin exceeding 11 was discussed.  I will call the patient if there is any abnormal lab results.  I will otherwise follow-up with her in 4 months visit and labs to assess the need for Aranesp.  # COPD on baseline oxygen -Stable.  Follows with pulmonary.  On inhalers.  # Hypertension -Stable.  On amlodipine and lisinopril  Orders Placed This Encounter  Procedures   Iron and TIBC   Ferritin   Vitamin B12   Folate   Erythropoietin   CBC with Differential   Multiple Myeloma Panel (SPEP&IFE w/QIG)  Kappa/lambda light chains   Hgb Fractionation Cascade   CBC with Differential/Platelet   RTC in 4 months for MD visit, labs, possible Aranesp.  Patient expressed understanding and was in agreement with this plan. She also understands that She can call clinic at any time with any questions, concerns, or complaints.   I spent a total of 45 minutes reviewing chart data, face-to-face evaluation with the patient, counseling and coordination of care as detailed above.  Jane Canary, MD   12/02/2022 2:31 PM

## 2022-12-02 NOTE — Progress Notes (Signed)
Patient has no concerns 

## 2022-12-03 LAB — ERYTHROPOIETIN: Erythropoietin: 12.8 m[IU]/mL (ref 2.6–18.5)

## 2022-12-05 ENCOUNTER — Telehealth: Payer: Self-pay | Admitting: Internal Medicine

## 2022-12-05 LAB — KAPPA/LAMBDA LIGHT CHAINS
Kappa free light chain: 21.5 mg/L — ABNORMAL HIGH (ref 3.3–19.4)
Kappa, lambda light chain ratio: 1.48 (ref 0.26–1.65)
Lambda free light chains: 14.5 mg/L (ref 5.7–26.3)

## 2022-12-05 NOTE — Telephone Encounter (Signed)
Called patient to inform about the labs. Her ferritin and iron saturation is high. Advised to stop iron pills. Patient expressed understanding.

## 2022-12-06 LAB — HGB FRACTIONATION CASCADE
Hgb A2: 2.3 % (ref 1.8–3.2)
Hgb A: 97.7 % (ref 96.4–98.8)
Hgb F: 0 % (ref 0.0–2.0)
Hgb S: 0 %

## 2022-12-07 LAB — MULTIPLE MYELOMA PANEL, SERUM
Albumin SerPl Elph-Mcnc: 3.8 g/dL (ref 2.9–4.4)
Albumin/Glob SerPl: 1.4 (ref 0.7–1.7)
Alpha 1: 0.3 g/dL (ref 0.0–0.4)
Alpha2 Glob SerPl Elph-Mcnc: 0.9 g/dL (ref 0.4–1.0)
B-Globulin SerPl Elph-Mcnc: 0.8 g/dL (ref 0.7–1.3)
Gamma Glob SerPl Elph-Mcnc: 0.8 g/dL (ref 0.4–1.8)
Globulin, Total: 2.8 g/dL (ref 2.2–3.9)
IgA: 226 mg/dL (ref 64–422)
IgG (Immunoglobin G), Serum: 806 mg/dL (ref 586–1602)
IgM (Immunoglobulin M), Srm: 101 mg/dL (ref 26–217)
Total Protein ELP: 6.6 g/dL (ref 6.0–8.5)

## 2023-03-04 ENCOUNTER — Encounter: Payer: Self-pay | Admitting: Internal Medicine

## 2023-04-03 ENCOUNTER — Inpatient Hospital Stay: Payer: 59 | Attending: Internal Medicine

## 2023-04-03 ENCOUNTER — Encounter: Payer: Self-pay | Admitting: Internal Medicine

## 2023-04-03 ENCOUNTER — Inpatient Hospital Stay (HOSPITAL_BASED_OUTPATIENT_CLINIC_OR_DEPARTMENT_OTHER): Payer: 59 | Admitting: Internal Medicine

## 2023-04-03 VITALS — BP 108/73 | HR 70 | Temp 96.8°F | Resp 20 | Ht 61.0 in | Wt 120.0 lb

## 2023-04-03 DIAGNOSIS — M069 Rheumatoid arthritis, unspecified: Secondary | ICD-10-CM | POA: Diagnosis not present

## 2023-04-03 DIAGNOSIS — D649 Anemia, unspecified: Secondary | ICD-10-CM

## 2023-04-03 DIAGNOSIS — D638 Anemia in other chronic diseases classified elsewhere: Secondary | ICD-10-CM | POA: Diagnosis not present

## 2023-04-03 DIAGNOSIS — Z79899 Other long term (current) drug therapy: Secondary | ICD-10-CM | POA: Diagnosis not present

## 2023-04-03 DIAGNOSIS — Z9981 Dependence on supplemental oxygen: Secondary | ICD-10-CM | POA: Insufficient documentation

## 2023-04-03 DIAGNOSIS — N1831 Chronic kidney disease, stage 3a: Secondary | ICD-10-CM | POA: Diagnosis not present

## 2023-04-03 DIAGNOSIS — D509 Iron deficiency anemia, unspecified: Secondary | ICD-10-CM | POA: Diagnosis present

## 2023-04-03 DIAGNOSIS — J449 Chronic obstructive pulmonary disease, unspecified: Secondary | ICD-10-CM | POA: Insufficient documentation

## 2023-04-03 DIAGNOSIS — Z87891 Personal history of nicotine dependence: Secondary | ICD-10-CM | POA: Insufficient documentation

## 2023-04-03 DIAGNOSIS — Z803 Family history of malignant neoplasm of breast: Secondary | ICD-10-CM | POA: Diagnosis not present

## 2023-04-03 DIAGNOSIS — I129 Hypertensive chronic kidney disease with stage 1 through stage 4 chronic kidney disease, or unspecified chronic kidney disease: Secondary | ICD-10-CM | POA: Insufficient documentation

## 2023-04-03 LAB — CBC WITH DIFFERENTIAL/PLATELET
Abs Immature Granulocytes: 0.02 10*3/uL (ref 0.00–0.07)
Basophils Absolute: 0.1 10*3/uL (ref 0.0–0.1)
Basophils Relative: 1 %
Eosinophils Absolute: 0.2 10*3/uL (ref 0.0–0.5)
Eosinophils Relative: 3 %
HCT: 31.4 % — ABNORMAL LOW (ref 36.0–46.0)
Hemoglobin: 9.3 g/dL — ABNORMAL LOW (ref 12.0–15.0)
Immature Granulocytes: 0 %
Lymphocytes Relative: 22 %
Lymphs Abs: 1.3 10*3/uL (ref 0.7–4.0)
MCH: 22 pg — ABNORMAL LOW (ref 26.0–34.0)
MCHC: 29.6 g/dL — ABNORMAL LOW (ref 30.0–36.0)
MCV: 74.4 fL — ABNORMAL LOW (ref 80.0–100.0)
Monocytes Absolute: 0.5 10*3/uL (ref 0.1–1.0)
Monocytes Relative: 9 %
Neutro Abs: 3.9 10*3/uL (ref 1.7–7.7)
Neutrophils Relative %: 65 %
Platelets: 178 10*3/uL (ref 150–400)
RBC: 4.22 MIL/uL (ref 3.87–5.11)
RDW: 16 % — ABNORMAL HIGH (ref 11.5–15.5)
WBC: 6 10*3/uL (ref 4.0–10.5)
nRBC: 0 % (ref 0.0–0.2)

## 2023-04-03 NOTE — Progress Notes (Signed)
[facility]  [phone] [phone]  ID: Joan Parker OB: Jan 16, 1946  MR#: [id number]  AOZ#:[id number]  Patient Care Team: Marisue Ivan, MD as PCP - General (Family Medicine)    HPI: Joan Parker is a 77 y.o. 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  She is on 2 L oxygen at baseline.  Denies any worsening shortness of breath or chest pain.  She follows with Dr. Allena Katz for rheumatoid arthritis and is on Orencia infusions.  Patient has a history of chronic anemia at least since 2017.  Hemoglobin has been ranging between 9-10.  MCV low around 75.  WBC and platelets are normal.  She also has history of CKD stage IIIa  Interval history Patient was seen today as follow-up to discuss labs for anemia. She has been doing okay overall.  Denies any new changes.  On 2 L oxygen for her COPD.  Denies any worsening of breathing or chest pain.  Denies any dizziness or blurry vision.   REVIEW OF SYSTEMS:   ROS  As per HPI. Otherwise, a complete review of systems is negative.  PAST MEDICAL HISTORY: Past Medical History:  Diagnosis Date   Anemia    Arthritis    COPD (chronic obstructive pulmonary disease) (HCC)    Hypertension     PAST SURGICAL HISTORY: Past Surgical History:  Procedure Laterality Date   COLONOSCOPY WITH PROPOFOL N/A 09/28/2016   Procedure: COLONOSCOPY WITH PROPOFOL;  Surgeon: Scot Jun, MD;  Location: [facility] ENDOSCOPY;  Service: Endoscopy;  Laterality: N/A;   ESOPHAGOGASTRODUODENOSCOPY (EGD) WITH PROPOFOL N/A 09/28/2016   Procedure: ESOPHAGOGASTRODUODENOSCOPY (EGD) WITH PROPOFOL;  Surgeon: Scot Jun, MD;  Location: [facility] ENDOSCOPY;  Service: Endoscopy;  Laterality: N/A;    FAMILY HISTORY: Family History  Problem Relation Age of Onset   Diabetes Father    Breast  cancer Maternal Aunt    Breast cancer Paternal Aunt     HEALTH MAINTENANCE: Social History   Tobacco Use   Smoking status: Former    Packs/day: 1.00    Years: 15.00    Additional pack years: 0.00    Total pack years: 15.00    Types: Cigarettes   Smokeless tobacco: Never  Substance Use Topics   Alcohol use: No   Drug use: No     No Known Allergies  Current Outpatient Medications  Medication Sig Dispense Refill   albuterol (VENTOLIN HFA) 108 (90 Base) MCG/ACT inhaler Inhale 2 Inhalers into the lungs every 4 (four) hours as needed for shortness of breath.     alendronate (FOSAMAX) 70 MG tablet Take 70 mg by mouth once a week. Take with a full glass of water on an empty stomach.     amLODipine (NORVASC) 10 MG tablet Take 1 tablet by mouth daily.     budesonide-formoterol (SYMBICORT) 160-4.5 MCG/ACT inhaler Inhale 2 puffs into the lungs 2 (two) times daily.     CALCIUM 600-D 600-400 MG-UNIT TABS Take 1 tablet by mouth 2 (two) times daily with a meal.     lisinopril (PRINIVIL,ZESTRIL) 40 MG tablet Take 40 mg by mouth daily.     omeprazole (PRILOSEC) 20 MG capsule Take 1 capsule by mouth daily.     OXYGEN Inhale 2 L/min into the lungs as needed (shortness of breath).     tiotropium (SPIRIVA HANDIHALER) 18 MCG inhalation capsule Take  1 capsule by mouth daily.     ergocalciferol (VITAMIN D2) 50000 units capsule Take 50,000 Units by mouth once a week. (Patient not taking: Reported on 12/02/2022)     No current facility-administered medications for this visit.    OBJECTIVE: Vitals:   04/03/23 1312  BP: 108/73  Pulse: 70  Resp: 20  Temp: (!) 96.8 F (36 C)  SpO2: 98%     Body mass index is 22.67 kg/m.      General: Well-developed, well-nourished, no acute distress. Eyes: Pink conjunctiva, anicteric sclera. HEENT: Normocephalic, moist mucous membranes, clear oropharnyx. Lungs: Clear to auscultation bilaterally. Heart: Regular rate and rhythm. No rubs, murmurs, or  gallops. Abdomen: Soft, nontender, nondistended. No organomegaly noted, normoactive bowel sounds. Musculoskeletal: No edema, cyanosis, or clubbing. Neuro: Alert, answering all questions appropriately. Cranial nerves grossly intact. Skin: No rashes or petechiae noted. Psych: Normal affect. Lymphatics: No cervical, calvicular, axillary or inguinal LAD.   LAB RESULTS:  Lab Results  Component Value Date   NA 142 05/14/2019   K 4.3 05/14/2019   CL 104 05/14/2019   CO2 30 05/14/2019   GLUCOSE 90 05/14/2019   BUN 34 (H) 05/14/2019   CREATININE 1.48 (H) 05/14/2019   CALCIUM 9.6 05/14/2019   PROT 7.5 05/14/2019   ALBUMIN 4.2 05/14/2019   AST 22 05/14/2019   ALT 13 05/14/2019   ALKPHOS 48 05/14/2019   BILITOT 0.3 05/14/2019   GFRNONAA 35 (L) 05/14/2019   GFRAA 40 (L) 05/14/2019    Lab Results  Component Value Date   WBC 6.0 04/03/2023   NEUTROABS 3.9 04/03/2023   HGB 9.3 (L) 04/03/2023   HCT 31.4 (L) 04/03/2023   MCV 74.4 (L) 04/03/2023   PLT 178 04/03/2023    Lab Results  Component Value Date   TIBC 262 12/02/2022   FERRITIN 1,163 (H) 12/02/2022   IRONPCTSAT 39 (H) 12/02/2022     STUDIES: No results found.  ASSESSMENT AND PLAN:   Joan Parker is a 77 y.o. female with pmh of anemia, COPD, hypertension, CKD stage IIIa, rheumatoid arthritis was referred to hematology for microcytic anemia.  # Microcytic anemia # CKD stage III -Anemia is likely secondary to chronic kidney disease and chronic inflammation from rheumatoid arthritis.    - SPEP/IFE no M spike.  Kappa 21.4, lambda 14 normal ratio 1.48.  Folate 14.  B12 395.  EPO 12.8 low for the degree of anemia.  Ferritin 1163.  Saturation 39%, iron 102.  Creatinine 1.3, GFR 42.  Hemoglobin electrophoresis normal.  CBC with differential today showed hemoglobin 9.3.  She has been maintaining her hemoglobin between 9-10 since 2017.  I discussed about the lab results and the indication for Aranesp if hemoglobin goes below 9.   She is asymptomatic from her anemia so I will hold off on starting her on injections.  Last visit, patient was advised to stop iron pills due to elevated ferritin.  I reconfirmed and patient has stopped it.  -Continue with surveillance with repeat hemoglobin in 4 months.  Plan to start Aranesp if hemoglobin goes below 9  # COPD on baseline oxygen -Stable.  Follows with pulmonary.  On inhalers.  # Hypertension -Stable.  On amlodipine and lisinopril  # Rheumatoid arthritis -Labs with Dr. Allena Katz.  On abatacept injections  Orders Placed This Encounter  Procedures   CBC with Differential/Platelet   RTC in 4 months for MD visit, labs, possible Aranesp.  Patient expressed understanding and was in agreement with this plan. She  also understands that She can call clinic at any time with any questions, concerns, or complaints.   I spent a total of 25 minutes reviewing chart data, face-to-face evaluation with the patient, counseling and coordination of care as detailed above.  Michaelyn Barter, MD   04/03/2023 1:36 PM

## 2023-07-13 ENCOUNTER — Other Ambulatory Visit: Payer: Self-pay | Admitting: Nephrology

## 2023-07-13 DIAGNOSIS — D631 Anemia in chronic kidney disease: Secondary | ICD-10-CM

## 2023-07-13 DIAGNOSIS — N1832 Chronic kidney disease, stage 3b: Secondary | ICD-10-CM

## 2023-07-18 ENCOUNTER — Encounter: Payer: Self-pay | Admitting: Internal Medicine

## 2023-07-20 ENCOUNTER — Ambulatory Visit
Admission: RE | Admit: 2023-07-20 | Discharge: 2023-07-20 | Disposition: A | Discharge status: Home / Self Care | Payer: 59 | Source: Ambulatory Visit | Attending: Nephrology | Admitting: Nephrology

## 2023-07-20 DIAGNOSIS — N1832 Chronic kidney disease, stage 3b: Secondary | ICD-10-CM | POA: Diagnosis present

## 2023-07-20 DIAGNOSIS — D631 Anemia in chronic kidney disease: Secondary | ICD-10-CM | POA: Diagnosis present

## 2023-07-21 ENCOUNTER — Other Ambulatory Visit: Payer: 59

## 2023-08-04 ENCOUNTER — Inpatient Hospital Stay: Payer: 59

## 2023-08-04 ENCOUNTER — Inpatient Hospital Stay: Payer: 59 | Admitting: Nurse Practitioner

## 2023-08-15 ENCOUNTER — Ambulatory Visit: Payer: 59

## 2023-08-15 ENCOUNTER — Encounter: Payer: Self-pay | Admitting: Nurse Practitioner

## 2023-08-15 ENCOUNTER — Inpatient Hospital Stay (HOSPITAL_BASED_OUTPATIENT_CLINIC_OR_DEPARTMENT_OTHER): Payer: 59 | Admitting: Nurse Practitioner

## 2023-08-15 ENCOUNTER — Inpatient Hospital Stay: Payer: 59 | Attending: Nurse Practitioner

## 2023-08-15 ENCOUNTER — Inpatient Hospital Stay: Payer: 59

## 2023-08-15 VITALS — BP 136/73 | HR 79 | Temp 97.2°F | Wt 123.0 lb

## 2023-08-15 DIAGNOSIS — N1832 Chronic kidney disease, stage 3b: Secondary | ICD-10-CM | POA: Insufficient documentation

## 2023-08-15 DIAGNOSIS — Z9981 Dependence on supplemental oxygen: Secondary | ICD-10-CM | POA: Diagnosis not present

## 2023-08-15 DIAGNOSIS — Z79899 Other long term (current) drug therapy: Secondary | ICD-10-CM | POA: Insufficient documentation

## 2023-08-15 DIAGNOSIS — D631 Anemia in chronic kidney disease: Secondary | ICD-10-CM | POA: Diagnosis not present

## 2023-08-15 DIAGNOSIS — M069 Rheumatoid arthritis, unspecified: Secondary | ICD-10-CM | POA: Diagnosis not present

## 2023-08-15 DIAGNOSIS — I129 Hypertensive chronic kidney disease with stage 1 through stage 4 chronic kidney disease, or unspecified chronic kidney disease: Secondary | ICD-10-CM | POA: Insufficient documentation

## 2023-08-15 DIAGNOSIS — J449 Chronic obstructive pulmonary disease, unspecified: Secondary | ICD-10-CM | POA: Insufficient documentation

## 2023-08-15 DIAGNOSIS — D649 Anemia, unspecified: Secondary | ICD-10-CM

## 2023-08-15 DIAGNOSIS — D509 Iron deficiency anemia, unspecified: Secondary | ICD-10-CM | POA: Insufficient documentation

## 2023-08-15 DIAGNOSIS — Z87891 Personal history of nicotine dependence: Secondary | ICD-10-CM | POA: Diagnosis not present

## 2023-08-15 DIAGNOSIS — Z803 Family history of malignant neoplasm of breast: Secondary | ICD-10-CM | POA: Insufficient documentation

## 2023-08-15 LAB — IRON AND TIBC
Iron: 50 ug/dL (ref 28–170)
Saturation Ratios: 23 % (ref 10.4–31.8)
TIBC: 217 ug/dL — ABNORMAL LOW (ref 250–450)
UIBC: 167 ug/dL

## 2023-08-15 LAB — CBC WITH DIFFERENTIAL/PLATELET
Abs Immature Granulocytes: 0.02 10*3/uL (ref 0.00–0.07)
Basophils Absolute: 0.1 10*3/uL (ref 0.0–0.1)
Basophils Relative: 1 %
Eosinophils Absolute: 0.2 10*3/uL (ref 0.0–0.5)
Eosinophils Relative: 2 %
HCT: 32.2 % — ABNORMAL LOW (ref 36.0–46.0)
Hemoglobin: 9.3 g/dL — ABNORMAL LOW (ref 12.0–15.0)
Immature Granulocytes: 0 %
Lymphocytes Relative: 23 %
Lymphs Abs: 1.5 10*3/uL (ref 0.7–4.0)
MCH: 21.7 pg — ABNORMAL LOW (ref 26.0–34.0)
MCHC: 28.9 g/dL — ABNORMAL LOW (ref 30.0–36.0)
MCV: 75.2 fL — ABNORMAL LOW (ref 80.0–100.0)
Monocytes Absolute: 0.7 10*3/uL (ref 0.1–1.0)
Monocytes Relative: 10 %
Neutro Abs: 4.3 10*3/uL (ref 1.7–7.7)
Neutrophils Relative %: 64 %
Platelets: 171 10*3/uL (ref 150–400)
RBC: 4.28 MIL/uL (ref 3.87–5.11)
RDW: 16.4 % — ABNORMAL HIGH (ref 11.5–15.5)
WBC: 6.7 10*3/uL (ref 4.0–10.5)
nRBC: 0 % (ref 0.0–0.2)

## 2023-08-15 LAB — FERRITIN: Ferritin: 900 ng/mL — ABNORMAL HIGH (ref 11–307)

## 2023-08-15 LAB — VITAMIN B12: Vitamin B-12: 403 pg/mL (ref 180–914)

## 2023-08-15 NOTE — Progress Notes (Unsigned)
[facility]  Telephone:([phone] [phone]) [phone]  ID: Joan Parker OB: 10/08/1946  MR#: [id number]  QIO#:[id number]  Patient Care Team: Marisue Ivan, MD as PCP - General (Family Medicine)  HPI: Joan Parker is a 77 y.o. 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  She is on 2 L oxygen at baseline.  Denies any worsening shortness of breath or chest pain.  She follows with Dr. Allena Katz for rheumatoid arthritis and is on Orencia infusions.  Patient has a history of chronic anemia at least since 2017.  Hemoglobin has been ranging between 9-10.  MCV low around 75.  WBC and platelets are normal.  She also has history of CKD stage IIIa  Interval history Patient was seen today as follow-up to discuss labs for anemia. She has been doing okay overall.  Denies any new changes.  On 2 L oxygen for her COPD.  Denies any worsening of breathing or chest pain.  Denies any dizziness or blurry vision.   REVIEW OF SYSTEMS:   ROS As per HPI. Otherwise, a complete review of systems is negative.  PAST MEDICAL HISTORY: Past Medical History:  Diagnosis Date   Anemia    Arthritis    COPD (chronic obstructive pulmonary disease) (HCC)    Hypertension     PAST SURGICAL HISTORY: Past Surgical History:  Procedure Laterality Date   COLONOSCOPY WITH PROPOFOL N/A 09/28/2016   Procedure: COLONOSCOPY WITH PROPOFOL;  Surgeon: Scot Jun, MD;  Location: [facility] ENDOSCOPY;  Service: Endoscopy;  Laterality: N/A;   ESOPHAGOGASTRODUODENOSCOPY (EGD) WITH PROPOFOL N/A 09/28/2016   Procedure: ESOPHAGOGASTRODUODENOSCOPY (EGD) WITH PROPOFOL;  Surgeon: Scot Jun, MD;  Location: [facility] ENDOSCOPY;  Service: Endoscopy;  Laterality: N/A;    FAMILY HISTORY: Family History  Problem Relation Age of Onset   Diabetes Father    Breast cancer  Maternal Aunt    Breast cancer Paternal Aunt     HEALTH MAINTENANCE: Social History   Tobacco Use   Smoking status: Former    Current packs/day: 1.00    Average packs/day: 1 pack/day for 15.0 years (15.0 ttl pk-yrs)    Types: Cigarettes   Smokeless tobacco: Never  Substance Use Topics   Alcohol use: No   Drug use: No     No Known Allergies  Current Outpatient Medications  Medication Sig Dispense Refill   albuterol (VENTOLIN HFA) 108 (90 Base) MCG/ACT inhaler Inhale 2 Inhalers into the lungs every 4 (four) hours as needed for shortness of breath.     alendronate (FOSAMAX) 70 MG tablet Take 70 mg by mouth once a week. Take with a full glass of water on an empty stomach.     amLODipine (NORVASC) 10 MG tablet Take 1 tablet by mouth daily.     budesonide-formoterol (SYMBICORT) 160-4.5 MCG/ACT inhaler Inhale 2 puffs into the lungs 2 (two) times daily.     CALCIUM 600-D 600-400 MG-UNIT TABS Take 1 tablet by mouth 2 (two) times daily with a meal.     lisinopril (PRINIVIL,ZESTRIL) 40 MG tablet Take 40 mg by mouth daily.     omeprazole (PRILOSEC) 20 MG capsule Take 1 capsule by mouth daily.     OXYGEN Inhale 2 L/min into the lungs as needed (shortness of breath).     tiotropium (SPIRIVA HANDIHALER) 18 MCG inhalation capsule Take 1 capsule by mouth daily.  ergocalciferol (VITAMIN D2) 50000 units capsule Take 50,000 Units by mouth once a week. (Patient not taking: Reported on 12/02/2022)     No current facility-administered medications for this visit.    OBJECTIVE: Vitals:   08/15/23 1312  BP: 136/73  Pulse: 79  Temp: (!) 97.2 F (36.2 C)  SpO2: 95%     Body mass index is 23.24 kg/m.      General: Well-developed, well-nourished, no acute distress. Eyes: Pink conjunctiva, anicteric sclera. HEENT: Normocephalic, moist mucous membranes, clear oropharnyx. Lungs: Clear to auscultation bilaterally. Heart: Regular rate and rhythm. No rubs, murmurs, or gallops. Abdomen: Soft,  nontender, nondistended. No organomegaly noted, normoactive bowel sounds. Musculoskeletal: No edema, cyanosis, or clubbing. Neuro: Alert, answering all questions appropriately. Cranial nerves grossly intact. Skin: No rashes or petechiae noted. Psych: Normal affect. Lymphatics: No cervical, calvicular, axillary or inguinal LAD.   LAB RESULTS:  Lab Results  Component Value Date   NA 142 05/14/2019   K 4.3 05/14/2019   CL 104 05/14/2019   CO2 30 05/14/2019   GLUCOSE 90 05/14/2019   BUN 34 (H) 05/14/2019   CREATININE 1.48 (H) 05/14/2019   CALCIUM 9.6 05/14/2019   PROT 7.5 05/14/2019   ALBUMIN 4.2 05/14/2019   AST 22 05/14/2019   ALT 13 05/14/2019   ALKPHOS 48 05/14/2019   BILITOT 0.3 05/14/2019   GFRNONAA 35 (L) 05/14/2019   GFRAA 40 (L) 05/14/2019    Lab Results  Component Value Date   WBC 6.7 08/15/2023   NEUTROABS 4.3 08/15/2023   HGB 9.3 (L) 08/15/2023   HCT 32.2 (L) 08/15/2023   MCV 75.2 (L) 08/15/2023   PLT 171 08/15/2023    Lab Results  Component Value Date   TIBC 262 12/02/2022   FERRITIN 1,163 (H) 12/02/2022   IRONPCTSAT 39 (H) 12/02/2022     STUDIES: US RENAL  Result Date: 07/27/2023 CLINICAL DATA:  Chronic kidney disease. EXAM: RENAL / URINARY TRACT ULTRASOUND COMPLETE COMPARISON:  None Available. FINDINGS: Right Kidney: Renal measurements: 9.0 x 4.0 x 3.8 cm = volume: 71.1 mL. Echogenicity within normal limits. No mass or hydronephrosis visualized. Left Kidney: Renal measurements: 7.8 x 5.0 x 4.0 cm = volume: 81.1 mL. Normal parenchymal echogenicity. 1.2 cm probable cyst arises from the pole. This is not well defined sonographically due to a limited sonographic window. No other renal masses, no stones and no hydronephrosis. Bladder: Appears normal for degree of bladder distention. Other: None. IMPRESSION: 1. Bilaterals small kidneys.  No acute findings.  No hydronephrosis. 2. Probable cyst arises from the upper pole of the left kidney, not well-defined. This  could be further assessed/characterized, if desired clinically, with renal MRI without and with contrast. Electronically Signed   By: Amie Portland M.D.   On: 07/27/2023 16:34    ASSESSMENT AND PLAN:   Joan Parker is a 77 y.o. female with pmh of anemia, COPD, hypertension, CKD stage IIIa, rheumatoid arthritis was referred to hematology for microcytic anemia.  # Anemia of CKD, stage 3b -Anemia is likely secondary to chronic kidney disease and chronic inflammation from rheumatoid arthritis.    - SPEP/IFE no M spike.  Kappa 21.4, lambda 14 normal ratio 1.48.  Folate 14.  B12 395.  EPO 12.8 low for the degree of anemia.  Ferritin 1163.  Saturation 39%, iron 102.  Creatinine 1.3, GFR 42.  Hemoglobin electrophoresis normal. She has not yet required aranesp but plan to start if hemoglobin < 9. She is otherwise asymptomatic of her anemia. Her  oral iron was stopped due to elevated ferritin. Today's levels are pending. Hemoglobin remains stable.   - we reviewed how ckd contributes to anemia and how this can be managed with optimization of iron stores as well as eventual use of EPO/aranesp. We discussed risks and benefits of those medications. She is currently        CBC with differential today showed hemoglobin 9.3.  She has been maintaining her hemoglobin between 9-10 since 2017.  I discussed about the lab results and the indication for Aranesp if hemoglobin goes below 9.  She is asymptomatic from her anemia so I will hold off on starting her on injections.  Last visit, patient was advised to stop iron pills due to elevated ferritin.  I reconfirmed and patient has stopped it.   # COPD on baseline oxygen -Stable.  Follows with pulmonary.  On inhalers.  # Hypertension -Stable.  On amlodipine and lisinopril  # Rheumatoid arthritis -Labs with Dr. Allena Katz.  On abatacept injections  No orders of the defined types were placed in this encounter.  4 mo- lab (cbc, cmp, ferritin, iron), Dr Alena Bills, +/-  aranesp- la  Patient expressed understanding and was in agreement with this plan. She also understands that She can call clinic at any time with any questions, concerns, or complaints.   I spent a total of 25 minutes reviewing chart data, face-to-face evaluation with the patient, counseling and coordination of care as detailed above.  Alinda Dooms, NP   08/15/2023 1:49 PM

## 2023-08-16 ENCOUNTER — Encounter: Payer: Self-pay | Admitting: Internal Medicine

## 2023-12-12 ENCOUNTER — Inpatient Hospital Stay: Payer: 59

## 2023-12-12 ENCOUNTER — Inpatient Hospital Stay: Payer: 59 | Admitting: Internal Medicine

## 2023-12-19 ENCOUNTER — Ambulatory Visit: Payer: 59

## 2023-12-19 ENCOUNTER — Other Ambulatory Visit: Payer: 59

## 2023-12-19 ENCOUNTER — Ambulatory Visit: Payer: 59 | Admitting: Internal Medicine

## 2024-02-29 ENCOUNTER — Other Ambulatory Visit: Payer: Self-pay | Admitting: Specialist

## 2024-02-29 DIAGNOSIS — J449 Chronic obstructive pulmonary disease, unspecified: Secondary | ICD-10-CM

## 2024-03-08 ENCOUNTER — Ambulatory Visit
Admission: RE | Admit: 2024-03-08 | Discharge: 2024-03-08 | Disposition: A | Discharge status: Home / Self Care | Source: Ambulatory Visit | Attending: Specialist | Admitting: Specialist

## 2024-03-08 DIAGNOSIS — J449 Chronic obstructive pulmonary disease, unspecified: Secondary | ICD-10-CM | POA: Diagnosis present

## 2024-06-08 ENCOUNTER — Emergency Department

## 2024-06-08 ENCOUNTER — Other Ambulatory Visit: Payer: Self-pay

## 2024-06-08 ENCOUNTER — Emergency Department
Admission: EM | Admit: 2024-06-08 | Discharge: 2024-06-08 | Disposition: A | Discharge status: Home / Self Care | Attending: Emergency Medicine | Admitting: Emergency Medicine

## 2024-06-08 DIAGNOSIS — W19XXXA Unspecified fall, initial encounter: Secondary | ICD-10-CM

## 2024-06-08 DIAGNOSIS — R0602 Shortness of breath: Secondary | ICD-10-CM

## 2024-06-08 DIAGNOSIS — W010XXA Fall on same level from slipping, tripping and stumbling without subsequent striking against object, initial encounter: Secondary | ICD-10-CM | POA: Insufficient documentation

## 2024-06-08 DIAGNOSIS — J449 Chronic obstructive pulmonary disease, unspecified: Secondary | ICD-10-CM | POA: Insufficient documentation

## 2024-06-08 DIAGNOSIS — M545 Low back pain, unspecified: Secondary | ICD-10-CM | POA: Insufficient documentation

## 2024-06-08 LAB — CBC
HCT: 28.6 % — ABNORMAL LOW (ref 36.0–46.0)
Hemoglobin: 8 g/dL — ABNORMAL LOW (ref 12.0–15.0)
MCH: 21.9 pg — ABNORMAL LOW (ref 26.0–34.0)
MCHC: 28 g/dL — ABNORMAL LOW (ref 30.0–36.0)
MCV: 78.4 fL — ABNORMAL LOW (ref 80.0–100.0)
Platelets: 83 K/uL — ABNORMAL LOW (ref 150–400)
RBC: 3.65 MIL/uL — ABNORMAL LOW (ref 3.87–5.11)
RDW: 16.8 % — ABNORMAL HIGH (ref 11.5–15.5)
WBC: 8.2 K/uL (ref 4.0–10.5)
nRBC: 0 % (ref 0.0–0.2)

## 2024-06-08 LAB — BASIC METABOLIC PANEL WITH GFR
Anion gap: 10 (ref 5–15)
BUN: 33 mg/dL — ABNORMAL HIGH (ref 8–23)
CO2: 30 mmol/L (ref 22–32)
Calcium: 9.3 mg/dL (ref 8.9–10.3)
Chloride: 102 mmol/L (ref 98–111)
Creatinine, Ser: 1.56 mg/dL — ABNORMAL HIGH (ref 0.44–1.00)
GFR, Estimated: 34 mL/min — ABNORMAL LOW (ref >60)
Glucose, Bld: 139 mg/dL — ABNORMAL HIGH (ref 70–99)
Potassium: 4.3 mmol/L (ref 3.5–5.1)
Sodium: 142 mmol/L (ref 135–145)

## 2024-06-08 MED ORDER — IPRATROPIUM-ALBUTEROL 0.5-2.5 (3) MG/3ML IN SOLN
3.0000 mL | Freq: Once | RESPIRATORY_TRACT | Status: AC
  Administered 2024-06-08: 3 mL via RESPIRATORY_TRACT
  Filled 2024-06-08: qty 3

## 2024-06-08 MED ORDER — OHTUVAYRE 3 MG/2.5ML IN SUSP: 1.0000 | Freq: Two times a day (BID) | RESPIRATORY_TRACT | 0 refills | Status: AC

## 2024-06-08 NOTE — ED Triage Notes (Signed)
 Pt to ED via POV from home. Pt reports had a mechanical fall from standing atfer tripping on shoes. Pt reports fell and landed backwards. Pt reports mid and lower back pain. Pt also reports increased SOB for awhile and wanting to be seen for that as well. Pt wears 2L [place] chronically.

## 2024-06-08 NOTE — Discharge Instructions (Addendum)
 Please use ibuprofen (Motrin) up to 800 mg every 8 hours, naproxen (Naprosyn) up to 500 mg every 12 hours, and/or acetaminophen (Tylenol) up to 4 g/day for any continued pain.  Please do not use this medication regimen for longer than 7 days

## 2024-06-08 NOTE — ED Provider Notes (Signed)
 [facility] Provider Note   Event Date/Time   First MD Initiated Contact with Patient 06/08/24 1643     (approximate) History  Fall and Shortness of Breath (/)  HPI Joan Parker is a 78 y.o. female with a past medical history of severe COPD on 2 L oxygen  chronically who presents after a mechanical fall from standing where she tripped over her shoes landing on her low back causing some pain to this region.  Patient denies any head trauma, loss of consciousness, or subsequent nausea/vomiting.  Patient is concerned as over the last few weeks she has had worsening shortness of breath despite no medication changes, no recent travel, no sick contacts.  Patient endorses exertional worsening of this shortness of breath.  Patient has not had to increase her home oxygen  and is continuing on 2 L/min. ROS: Patient currently denies any fever, vision changes, tinnitus, difficulty speaking, facial droop, sore throat, chest pain, shortness of breath, abdominal pain, nausea/vomiting/diarrhea, dysuria, or weakness/numbness/paresthesias in any extremity   Physical Exam  Triage Vital Signs: ED Triage Vitals [06/08/24 1635]  Encounter Vitals Group     BP 114/73     Girls Systolic BP Percentile      Girls Diastolic BP Percentile      Boys Systolic BP Percentile      Boys Diastolic BP Percentile      Pulse Rate 91     Resp 20     Temp 98.7 F (37.1 C)     Temp Source Oral     SpO2 94 %     Weight      Height      Head Circumference      Peak Flow      Pain Score      Pain Loc      Pain Education      Exclude from Growth Chart    Most recent vital signs: Vitals:   06/08/24 1635  BP: 114/73  Pulse: 91  Resp: 20  Temp: 98.7 F (37.1 C)  SpO2: 94%   General: Awake, oriented x4. CV:  Good peripheral perfusion. Resp:  Normal effort.  Inspiratory and expiratory wheezing over bilateral lung field Abd:  No distention. Other:  Elderly well-developed, well-nourished  African-American female resting comfortably in no acute distress ED Results / Procedures / Treatments  Labs (all labs ordered are listed, but only abnormal results are displayed) Labs Reviewed  BASIC METABOLIC PANEL WITH GFR - Abnormal; Notable for the following components:      Result Value   Glucose, Bld 139 (*)    BUN 33 (*)    Creatinine, Ser 1.56 (*)    GFR, Estimated 34 (*)    All other components within normal limits  CBC   EKG ED ECG REPORT I, Artist MARLA Kerns, the attending physician, personally viewed and interpreted this ECG. Date: 06/08/2024 EKG Time: 1640 Rate: 86 Rhythm: normal sinus rhythm QRS Axis: normal Intervals: normal ST/T Wave abnormalities: normal Narrative Interpretation: no evidence of acute ischemia RADIOLOGY ED MD interpretation: One-view portable chest x-ray interpreted by me shows no evidence of acute abnormalities including no pneumonia, pneumothorax, or widened mediastinum - All radiology independently interpreted and agree with radiology assessment Official radiology report(s): DG Chest 1 View Result Date: 06/08/2024 CLINICAL DATA:  Mechanical fall.  Shortness of breath. EXAM: CHEST  1 VIEW COMPARISON:  CT 03/08/2024 FINDINGS: Stable cardiomediastinal silhouette. Aortic atherosclerotic calcification. Hyperinflation. No focal consolidation, pleural effusion, or pneumothorax. No  displaced rib fractures. Chronic wedging of a few midthoracic vertebral bodies. Left Bochdalek hernia. IMPRESSION: No acute cardiopulmonary disease. Electronically Signed   By: Norman Gatlin M.D.   On: 06/08/2024 17:20   PROCEDURES: Critical Care performed: No .1-3 Lead EKG Interpretation  Performed by: Jossie Artist POUR, MD Authorized by: Jossie Artist POUR, MD     Interpretation: normal     ECG rate:  71   ECG rate assessment: normal     Rhythm: sinus rhythm     Ectopy: none     Conduction: normal    MEDICATIONS ORDERED IN ED: Medications  ipratropium-albuterol   (DUONEB) 0.5-2.5 (3) MG/3ML nebulizer solution 3 mL (3 mLs Nebulization Given 06/08/24 1700)   IMPRESSION / MDM / ASSESSMENT AND PLAN / ED COURSE  I reviewed the triage vital signs and the nursing notes.                             The patient is on the cardiac monitor to evaluate for evidence of arrhythmia and/or significant heart rate changes. Patient's presentation is most consistent with acute presentation with potential threat to life or bodily function. The patient appears to be suffering from shortness of breath secondary to very severe COPD.  Based on the history, exam, CXR/EKG, and further workup I don't suspect any other emergent cause of this presentation, such as pneumonia, acute coronary syndrome, congestive heart failure, pulmonary embolism, or pneumothorax.  ED Interventions: bronchodilators, reassess  Reassessment: After treatment, the patient's shortness of breath is mildly improved. They are comfortable and want to go home.  Rx: ohtuvayre  as recommended in Dr. Aletha note and office visit on 04/01/2024 Disposition: Discharge home with SRP. PCP follow up recommended in next 48hours.   FINAL CLINICAL IMPRESSION(S) / ED DIAGNOSES   Final diagnoses:  Fall, initial encounter  Acute bilateral low back pain without sciatica  Shortness of breath  COPD, very severe (HCC)   Rx / DC Orders   ED Discharge Orders          Ordered    Ensifentrine  (OHTUVAYRE ) 3 MG/2.5ML SUSP  2 times daily        06/08/24 1800           Note:  This document was prepared using Dragon voice recognition software and may include unintentional dictation errors.   Illya Gienger K, MD 06/08/24 [id number]

## 2024-06-08 NOTE — ED Notes (Signed)
 Patient requesting d/c; MD informed.

## 2024-09-02 ENCOUNTER — Emergency Department

## 2024-09-02 ENCOUNTER — Inpatient Hospital Stay
Admission: EM | Admit: 2024-09-02 | Discharge: 2024-09-04 | DRG: 190 | Disposition: A | Attending: Obstetrics and Gynecology | Admitting: Obstetrics and Gynecology

## 2024-09-02 ENCOUNTER — Other Ambulatory Visit: Payer: Self-pay

## 2024-09-02 DIAGNOSIS — N1832 Chronic kidney disease, stage 3b: Secondary | ICD-10-CM | POA: Diagnosis present

## 2024-09-02 DIAGNOSIS — M1990 Unspecified osteoarthritis, unspecified site: Secondary | ICD-10-CM | POA: Diagnosis present

## 2024-09-02 DIAGNOSIS — J9621 Acute and chronic respiratory failure with hypoxia: Secondary | ICD-10-CM | POA: Diagnosis present

## 2024-09-02 DIAGNOSIS — M7989 Other specified soft tissue disorders: Secondary | ICD-10-CM | POA: Diagnosis present

## 2024-09-02 DIAGNOSIS — Z7983 Long term (current) use of bisphosphonates: Secondary | ICD-10-CM | POA: Diagnosis not present

## 2024-09-02 DIAGNOSIS — Z87891 Personal history of nicotine dependence: Secondary | ICD-10-CM

## 2024-09-02 DIAGNOSIS — R0602 Shortness of breath: Secondary | ICD-10-CM | POA: Diagnosis present

## 2024-09-02 DIAGNOSIS — J439 Emphysema, unspecified: Secondary | ICD-10-CM | POA: Diagnosis present

## 2024-09-02 DIAGNOSIS — R531 Weakness: Secondary | ICD-10-CM | POA: Diagnosis present

## 2024-09-02 DIAGNOSIS — Z1152 Encounter for screening for COVID-19: Secondary | ICD-10-CM

## 2024-09-02 DIAGNOSIS — K219 Gastro-esophageal reflux disease without esophagitis: Secondary | ICD-10-CM | POA: Diagnosis present

## 2024-09-02 DIAGNOSIS — I129 Hypertensive chronic kidney disease with stage 1 through stage 4 chronic kidney disease, or unspecified chronic kidney disease: Secondary | ICD-10-CM | POA: Diagnosis present

## 2024-09-02 DIAGNOSIS — Z66 Do not resuscitate: Secondary | ICD-10-CM | POA: Diagnosis present

## 2024-09-02 DIAGNOSIS — J441 Chronic obstructive pulmonary disease with (acute) exacerbation: Principal | ICD-10-CM | POA: Diagnosis present

## 2024-09-02 DIAGNOSIS — M069 Rheumatoid arthritis, unspecified: Secondary | ICD-10-CM | POA: Diagnosis present

## 2024-09-02 DIAGNOSIS — Z9981 Dependence on supplemental oxygen: Secondary | ICD-10-CM | POA: Diagnosis not present

## 2024-09-02 DIAGNOSIS — Z79899 Other long term (current) drug therapy: Secondary | ICD-10-CM

## 2024-09-02 DIAGNOSIS — Z833 Family history of diabetes mellitus: Secondary | ICD-10-CM | POA: Diagnosis not present

## 2024-09-02 LAB — BASIC METABOLIC PANEL WITH GFR
Anion gap: 12 (ref 5–15)
BUN: 21 mg/dL (ref 8–23)
CO2: 29 mmol/L (ref 22–32)
Calcium: 9.5 mg/dL (ref 8.9–10.3)
Chloride: 98 mmol/L (ref 98–111)
Creatinine, Ser: 1.44 mg/dL — ABNORMAL HIGH (ref 0.44–1.00)
GFR, Estimated: 37 mL/min — ABNORMAL LOW (ref >60)
Glucose, Bld: 124 mg/dL — ABNORMAL HIGH (ref 70–99)
Potassium: 5 mmol/L (ref 3.5–5.1)
Sodium: 139 mmol/L (ref 135–145)

## 2024-09-02 LAB — CBC
HCT: 27.7 % — ABNORMAL LOW (ref 36.0–46.0)
Hemoglobin: 7.8 g/dL — ABNORMAL LOW (ref 12.0–15.0)
MCH: 22.2 pg — ABNORMAL LOW (ref 26.0–34.0)
MCHC: 28.2 g/dL — ABNORMAL LOW (ref 30.0–36.0)
MCV: 78.7 fL — ABNORMAL LOW (ref 80.0–100.0)
Platelets: 161 K/uL (ref 150–400)
RBC: 3.52 MIL/uL — ABNORMAL LOW (ref 3.87–5.11)
RDW: 16.7 % — ABNORMAL HIGH (ref 11.5–15.5)
WBC: 7.1 K/uL (ref 4.0–10.5)
nRBC: 0 % (ref 0.0–0.2)

## 2024-09-02 LAB — TROPONIN I (HIGH SENSITIVITY)
Troponin I (High Sensitivity): 7 ng/L (ref <18)
Troponin I (High Sensitivity): 7 ng/L (ref <18)

## 2024-09-02 LAB — RESP PANEL BY RT-PCR (RSV, FLU A&B, COVID)  RVPGX2
Influenza A by PCR: NEGATIVE
Influenza B by PCR: NEGATIVE
Resp Syncytial Virus by PCR: NEGATIVE
SARS Coronavirus 2 by RT PCR: NEGATIVE

## 2024-09-02 MED ORDER — ACETAMINOPHEN 650 MG RE SUPP: 650.0000 mg | Freq: Four times a day (QID) | RECTAL | Status: DC | PRN

## 2024-09-02 MED ORDER — IPRATROPIUM-ALBUTEROL 0.5-2.5 (3) MG/3ML IN SOLN
3.0000 mL | Freq: Once | RESPIRATORY_TRACT | Status: AC
  Administered 2024-09-02: 3 mL via RESPIRATORY_TRACT
  Filled 2024-09-02: qty 3

## 2024-09-02 MED ORDER — POLYETHYLENE GLYCOL 3350 17 G PO PACK: 17.0000 g | PACK | Freq: Every day | ORAL | Status: DC | PRN

## 2024-09-02 MED ORDER — METHYLPREDNISOLONE SODIUM SUCC 40 MG IJ SOLR
40.0000 mg | Freq: Two times a day (BID) | INTRAMUSCULAR | Status: AC
  Administered 2024-09-03 (×2): 40 mg via INTRAVENOUS
  Filled 2024-09-02 (×2): qty 1

## 2024-09-02 MED ORDER — PREDNISONE 20 MG PO TABS
40.0000 mg | ORAL_TABLET | Freq: Every day | ORAL | Status: DC
  Administered 2024-09-04: 40 mg via ORAL
  Filled 2024-09-02: qty 2

## 2024-09-02 MED ORDER — ENOXAPARIN SODIUM 30 MG/0.3ML IJ SOSY
30.0000 mg | PREFILLED_SYRINGE | INTRAMUSCULAR | Status: DC
  Administered 2024-09-02 – 2024-09-03 (×2): 30 mg via SUBCUTANEOUS
  Filled 2024-09-02 (×2): qty 0.3

## 2024-09-02 MED ORDER — LEFLUNOMIDE 10 MG PO TABS
20.0000 mg | ORAL_TABLET | Freq: Every day | ORAL | Status: DC
Start: 2024-09-03 — End: 2024-09-04
  Administered 2024-09-03 – 2024-09-04 (×2): 20 mg via ORAL
  Filled 2024-09-02 (×2): qty 2

## 2024-09-02 MED ORDER — ALBUTEROL SULFATE (2.5 MG/3ML) 0.083% IN NEBU
2.5000 mg | INHALATION_SOLUTION | RESPIRATORY_TRACT | Status: DC | PRN
  Administered 2024-09-03: 2.5 mg via RESPIRATORY_TRACT
  Filled 2024-09-02: qty 3

## 2024-09-02 MED ORDER — AMLODIPINE BESYLATE 10 MG PO TABS
10.0000 mg | ORAL_TABLET | Freq: Every day | ORAL | Status: DC
  Administered 2024-09-03 – 2024-09-04 (×2): 10 mg via ORAL
  Filled 2024-09-02 (×2): qty 1

## 2024-09-02 MED ORDER — IPRATROPIUM-ALBUTEROL 0.5-2.5 (3) MG/3ML IN SOLN
3.0000 mL | Freq: Four times a day (QID) | RESPIRATORY_TRACT | Status: DC
  Administered 2024-09-02 – 2024-09-04 (×8): 3 mL via RESPIRATORY_TRACT
  Filled 2024-09-02 (×8): qty 3

## 2024-09-02 MED ORDER — ACETAMINOPHEN 325 MG PO TABS: 650.0000 mg | ORAL_TABLET | Freq: Four times a day (QID) | ORAL | Status: DC | PRN

## 2024-09-02 MED ORDER — METHYLPREDNISOLONE SODIUM SUCC 125 MG IJ SOLR
125.0000 mg | Freq: Once | INTRAMUSCULAR | Status: AC
  Administered 2024-09-02: 125 mg via INTRAVENOUS
  Filled 2024-09-02: qty 2

## 2024-09-02 MED ORDER — GUAIFENESIN ER 600 MG PO TB12
600.0000 mg | ORAL_TABLET | Freq: Two times a day (BID) | ORAL | Status: DC
  Administered 2024-09-02 – 2024-09-04 (×4): 600 mg via ORAL
  Filled 2024-09-02 (×4): qty 1

## 2024-09-02 MED ORDER — LISINOPRIL 20 MG PO TABS
40.0000 mg | ORAL_TABLET | Freq: Every day | ORAL | Status: DC
  Administered 2024-09-03 – 2024-09-04 (×2): 40 mg via ORAL
  Filled 2024-09-02 (×2): qty 2

## 2024-09-02 MED ORDER — PANTOPRAZOLE SODIUM 40 MG PO TBEC
40.0000 mg | DELAYED_RELEASE_TABLET | Freq: Every day | ORAL | Status: DC
  Administered 2024-09-03 – 2024-09-04 (×2): 40 mg via ORAL
  Filled 2024-09-02 (×2): qty 1

## 2024-09-02 MED ORDER — SODIUM CHLORIDE 0.9 % IV SOLN
1.0000 g | INTRAVENOUS | Status: DC
  Administered 2024-09-02: 1 g via INTRAVENOUS
  Filled 2024-09-02: qty 10

## 2024-09-02 MED ORDER — BUDESON-GLYCOPYRROL-FORMOTEROL 160-9-4.8 MCG/ACT IN AERO
2.0000 | INHALATION_SPRAY | Freq: Two times a day (BID) | RESPIRATORY_TRACT | Status: DC
  Administered 2024-09-02 – 2024-09-04 (×4): 2 via RESPIRATORY_TRACT
  Filled 2024-09-02 (×2): qty 5.9

## 2024-09-02 MED ORDER — SORBITOL 70 % SOLN: 30.0000 mL | Freq: Every day | Status: DC | PRN

## 2024-09-02 NOTE — ED Notes (Signed)
Blue top in lab 

## 2024-09-02 NOTE — ED Notes (Signed)
 Audible wheezing throughout noted, decreased from initial assessment.

## 2024-09-02 NOTE — ED Notes (Signed)
 Pt states baseline is 2L [place], over last week pt has needed 3L [place]. Pt's WOB is increased and pt taking breath every 2-3 words. Pt currently on 4L [place] and SPO2 is 100%.

## 2024-09-02 NOTE — ED Triage Notes (Addendum)
 Pt c/o of worsening SHOB x3 days. Pt is AOX4, respirations even, labored, audible wheeze noted. Pt has had breathing treatments at home w/ out relief. Pt is on 2 liter's chronically, turned up to 4 liters by KC.

## 2024-09-02 NOTE — H&P (Signed)
 History and Physical    Patient: Joan Parker FMW:[id number] DOB: Apr 23, 1946 DOA: 09/02/2024 DOS: the patient was seen and examined on 09/02/2024 PCP: Alla Amis, MD  Patient coming from: Home  Chief Complaint:  Chief Complaint  Patient presents with   Shortness of Breath   HPI: Joan Parker is a 78 y.o. 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  She presented to the ED today for evaluation of shortness of breath.  Patient reports 3 days of worsening shortness of breath and work of breathing over her baseline.  She increased use of her home inhalers without improvement.  Today, she needed to turn her oxygen  up to 3 and 4 L/min.  She denies sick contacts, fevers chills, sore throat congestion cough or any recent illnesses.  Denies other symptoms as well including nausea vomit diarrhea, dysuria hematuria, weakness, numbness tingling.  ED course --temp 98.6 F, HR 85, RR 25, BP 120/80, SpO2 100% on 4 L nasal cannula O2.  Labs were obtained including BMP and CBC that were notable for nonfasting glucose 124, creatinine 1.44, hemoglobin 7.8.  Viral PCR is ordered and negative for COVID, flu and RSV.  Chest x-ray showed emphysema but no acute findings.  EKG showed normal sinus rhythm at 88 bpm without acute ischemic changes.  Troponin was normal at 7 twice.  Patient was treated with 125 mg IV Solu-Medrol  and multiple DuoNeb treatments but continued to have increased work of breathing, conversational dyspnea and diffuse wheezing.  Therefore patient is admitted for further management of COPD with acute exacerbation.    Review of Systems: As mentioned in the history of present illness. All other systems reviewed and are negative.   Past Medical History:  Diagnosis Date   Anemia    Arthritis    COPD (chronic obstructive pulmonary disease) (HCC)    Hypertension     Past Surgical History:  Procedure Laterality Date   COLONOSCOPY WITH PROPOFOL  N/A 09/28/2016   Procedure: COLONOSCOPY WITH PROPOFOL ;  Surgeon: Lamar ONEIDA Holmes, MD;  Location: [facility] ENDOSCOPY;  Service: Endoscopy;  Laterality: N/A;   ESOPHAGOGASTRODUODENOSCOPY (EGD) WITH PROPOFOL  N/A 09/28/2016   Procedure: ESOPHAGOGASTRODUODENOSCOPY (EGD) WITH PROPOFOL ;  Surgeon: Lamar ONEIDA Holmes, MD;  Location: [facility] ENDOSCOPY;  Service: Endoscopy;  Laterality: N/A;   Social History:  reports that she has quit smoking. Her smoking use included cigarettes. She has a 15 pack-year smoking history. She has never used smokeless tobacco. She reports that she does not drink alcohol and does not use drugs.  No Known Allergies  Family History  Problem Relation Age of Onset   Diabetes Father    Breast cancer Maternal Aunt    Breast cancer Paternal Aunt     Prior to Admission medications   Medication Sig Start Date End Date Taking? Authorizing Provider  albuterol  (VENTOLIN  HFA) 108 (90 Base) MCG/ACT inhaler Inhale 2 Inhalers into the lungs every 4 (four) hours as needed for shortness of breath. 02/07/19   [provider]  alendronate (FOSAMAX) 70 MG tablet Take 70 mg by mouth once a week. Take with a full glass of water on an empty stomach.    [provider]  amLODipine (NORVASC) 10 MG tablet Take 1 tablet by mouth daily. 11/25/22   [provider]  CALCIUM 600-D 600-400 MG-UNIT TABS Take 1 tablet by mouth 2 (two) times daily with a meal. 04/27/19  [provider]  ergocalciferol (VITAMIN D2) 50000 units capsule Take 50,000 Units by mouth once a week. Patient not taking: Reported on 12/02/2022    [provider]  lisinopril (PRINIVIL,ZESTRIL) 40 MG tablet Take 40 mg by mouth daily.    [provider]  omeprazole (PRILOSEC) 20 MG capsule Take 1 capsule by mouth daily. 02/06/19   [provider]  OXYGEN  Inhale 2 L/min into the lungs as needed (shortness of  breath).    [provider]  tiotropium (SPIRIVA HANDIHALER) 18 MCG inhalation capsule Take 1 capsule by mouth daily. 04/18/18   [provider]    Physical Exam: Vitals:   09/02/24 1447 09/02/24 1532 09/02/24 1600 09/02/24 1618  BP: 120/80 (!) 126/99 (!) 132/98 (!) 132/98  Pulse: 85 88 83 83  Resp: (!) 25  (!) 21 17  Temp: 98.6 F (37 C)   98.2 F (36.8 C)  TempSrc: Oral   Oral  SpO2: 100% 100% 100% 100%   General exam: awake, alert, no acute distress HEENT: atraumatic, clear conjunctiva, anicteric sclera, moist mucus membranes, hearing grossly normal  Respiratory system: Severely diminished aeration, diffuse expiratory and audible wheezing, conversational dyspnea, no rhonchi, on 4 L/min [place] O2. Cardiovascular system: normal S1/S2, RRR, no JVD, murmurs, rubs, gallops, no pedal edema.   Gastrointestinal system: soft, NT, ND, no HSM felt, +bowel sounds. Central nervous system: A&O x 3. no gross focal neurologic deficits, normal speech Extremities: moves all, no edema, normal tone Skin: dry, intact, normal temperature, normal color, No rashes, lesions or ulcers Psychiatry: normal mood, congruent affect, judgement and insight appear normal   Data Reviewed:  As reviewed in detail above  Assessment and Plan:  COPD with acute exacerbation Acute on chronic respiratory failure with hypoxia Baseline O2 requirement is 2 L/min, currently requiring 3 to 4 L/min.  Diffuse wheezing and severely diminished air movement on exam consistent with COPD exacerbation. -- Continue IV Solu-Medrol  40 mg twice daily --Scheduled DuoNebs Q6H -- As needed albuterol  nebs Q2H -- Rocephin -- Scheduled Mucinex -- Breztri substitute for home Trelegy -- Pulmonary hygiene with I-S and flutter -- Supplement O2 to keep sats 88 to 94%  Essential hypertension --BPs are controlled. Home regimen is amlodipine 10 mg, lisinopril 40 mg --Continue amlodipine -- Resume lisinopril tomorrow if BP  warrants  Rheumatoid arthritis -without flare symptoms -- Continue leflunomide  GERD -- Continue PPI    Advance Care Planning: CODE STATUS DNR Discussed CODE STATUS with patient and she confirms she would not want resuscitation for cardiac arrest or intubation/mechanical ventilation if her breathing could not be supported with noninvasive means.  Consults: None  Family Communication: Friend Eddie who is patient's POA was at bedside during admission encounter  Severity of Illness: The appropriate patient status for this patient is INPATIENT. Inpatient status is judged to be reasonable and necessary in order to provide the required intensity of service to ensure the patient's safety. The patient's presenting symptoms, physical exam findings, and initial radiographic and laboratory data in the context of their chronic comorbidities is felt to place them at high risk for further clinical deterioration. Furthermore, it is not anticipated that the patient will be medically stable for discharge from the hospital within 2 midnights of admission.   * I certify that at the point of admission it is my clinical judgment that the patient will require inpatient hospital care spanning beyond 2 midnights from the point of admission due to high intensity of service, high risk for  further deterioration and high frequency of surveillance required.*  Author: Burnard DELENA Cunning, DO 09/02/2024 6:51 PM  For on call review [email or web address].

## 2024-09-02 NOTE — Progress Notes (Signed)
 PHARMACIST - PHYSICIAN COMMUNICATION  CONCERNING:  Enoxaparin (Lovenox) for DVT Prophylaxis    RECOMMENDATION: Patient was prescribed enoxaprin 40mg  q24 hours for VTE prophylaxis.   Filed Weights   09/02/24 2100  Weight: 50.1 kg (110 lb 7.2 oz)    Body mass index is 20.2 kg/m.  Estimated Creatinine Clearance: 25.5 mL/min (A) (by C-G formula based on SCr of 1.44 mg/dL (H)).   Patient is candidate for enoxaparin 30mg  every 24 hours based on CrCl <37ml/min or Weight <45kg  DESCRIPTION: Pharmacy has adjusted enoxaparin dose per [facility] policy.  Patient is now receiving enoxaparin 30 mg every 24 hours    Annabella LOISE Banks, PharmD Clinical Pharmacist  09/02/2024 10:21 PM

## 2024-09-02 NOTE — ED Provider Notes (Signed)
 [facility] Provider Note    Event Date/Time   First MD Initiated Contact with Patient 09/02/24 1526     (approximate)   History   Shortness of Breath   HPI  Joan Parker is a 78 y.o. female who presents to the emergency department today for shortness of breath.  She states it has been going on for at least the past 3 days.  Does have a history of COPD.  Has been trying her home breathing treatments without any significant relief.  Denies any associated chest pain.  Very mild cough.  No fevers or chills.  Has noticed some swelling in her legs but states she has been taking her fluid pill because she does not want to have to get up and walk a lot with her shortness of breath.     Physical Exam   Triage Vital Signs: ED Triage Vitals  Encounter Vitals Group     BP 09/02/24 1447 120/80     Girls Systolic BP Percentile --      Girls Diastolic BP Percentile --      Boys Systolic BP Percentile --      Boys Diastolic BP Percentile --      Pulse Rate 09/02/24 1447 85     Resp 09/02/24 1447 (!) 25     Temp 09/02/24 1447 98.6 F (37 C)     Temp Source 09/02/24 1447 Oral     SpO2 09/02/24 1447 100 %     Weight --      Height --      Head Circumference --      Peak Flow --      Pain Score 09/02/24 1448 0     Pain Loc --      Pain Education --      Exclude from Growth Chart --     Most recent vital signs: Vitals:   09/02/24 1447 09/02/24 1532  BP: 120/80 (!) 126/99  Pulse: 85 88  Resp: (!) 25   Temp: 98.6 F (37 C)   SpO2: 100% 100%   General: Awake, alert, oriented. CV:  Good peripheral perfusion. Regular rate and rhythm. Resp:  Increased work of breathing. Poor air movement. Diffuse expiratory wheezing. Abd:  No distention.   ED Results / Procedures / Treatments   Labs (all labs ordered are listed, but only abnormal results are displayed) Labs Reviewed  BASIC METABOLIC PANEL WITH GFR - Abnormal; Notable for the following components:       Result Value   Glucose, Bld 124 (*)    Creatinine, Ser 1.44 (*)    GFR, Estimated 37 (*)    All other components within normal limits  CBC - Abnormal; Notable for the following components:   RBC 3.52 (*)    Hemoglobin 7.8 (*)    HCT 27.7 (*)    MCV 78.7 (*)    MCH 22.2 (*)    MCHC 28.2 (*)    RDW 16.7 (*)    All other components within normal limits  RESP PANEL BY RT-PCR (RSV, FLU A&B, COVID)  [id number]  TROPONIN I (HIGH SENSITIVITY)  TROPONIN I (HIGH SENSITIVITY)     EKG  I, Guadalupe Eagles, attending physician, personally viewed and interpreted this EKG  EKG Time: 1504 Rate: 88 Rhythm: sinus rhythm Axis: normal Intervals: qtc 440 QRS: narrow, q waves v1, v2, v3 ST changes: no st elevation Impression: abnormal ekg   RADIOLOGY I independently interpreted and visualized the  CXR. My interpretation: No pneumonia Radiology interpretation:  IMPRESSION:  Emphysematous changes in the lungs. No evidence of active pulmonary  disease.      PROCEDURES:  Critical Care performed: No    MEDICATIONS ORDERED IN ED: Medications  ipratropium-albuterol  (DUONEB) 0.5-2.5 (3) MG/3ML nebulizer solution 3 mL (3 mLs Nebulization Given 09/02/24 1459)     IMPRESSION / MDM / ASSESSMENT AND PLAN / ED COURSE  I reviewed the triage vital signs and the nursing notes.                              Differential diagnosis includes, but is not limited to, pneumonia, COPD, viral illness, anemia  Patient's presentation is most consistent with acute presentation with potential threat to life or bodily function.   The patient is on the cardiac monitor to evaluate for evidence of arrhythmia and/or significant heart rate changes.  Patient presented to the emergency department today because of concerns for shortness of breath.  On exam patient does have increased work of breathing with diffuse expiratory wheezing.  History of COPD.  Chest x-ray without pneumonia.  Patient is anemic here however  has baseline anemia and today's value is not significantly different from recent blood work.  Patient was given Solu-Medrol  and breathing treatments here in the emergency department.  While she did seem to have some improvement continue to have increased work of breathing.  Discussed with Dr. Fausto with hospitalist service who will evaluate for admission.      FINAL CLINICAL IMPRESSION(S) / ED DIAGNOSES   Final diagnoses:  COPD exacerbation (HCC)     Note:  This document was prepared using Dragon voice recognition software and may include unintentional dictation errors.    Floy Roberts, MD 09/02/24 [id number]

## 2024-09-03 LAB — CBC
HCT: 28.6 % — ABNORMAL LOW (ref 36.0–46.0)
Hemoglobin: 8 g/dL — ABNORMAL LOW (ref 12.0–15.0)
MCH: 21.7 pg — ABNORMAL LOW (ref 26.0–34.0)
MCHC: 28 g/dL — ABNORMAL LOW (ref 30.0–36.0)
MCV: 77.7 fL — ABNORMAL LOW (ref 80.0–100.0)
Platelets: 182 K/uL (ref 150–400)
RBC: 3.68 MIL/uL — ABNORMAL LOW (ref 3.87–5.11)
RDW: 16.7 % — ABNORMAL HIGH (ref 11.5–15.5)
WBC: 6.4 K/uL (ref 4.0–10.5)
nRBC: 0 % (ref 0.0–0.2)

## 2024-09-03 LAB — BASIC METABOLIC PANEL WITH GFR
Anion gap: 11 (ref 5–15)
BUN: 26 mg/dL — ABNORMAL HIGH (ref 8–23)
CO2: 27 mmol/L (ref 22–32)
Calcium: 9.5 mg/dL (ref 8.9–10.3)
Chloride: 101 mmol/L (ref 98–111)
Creatinine, Ser: 1.35 mg/dL — ABNORMAL HIGH (ref 0.44–1.00)
GFR, Estimated: 40 mL/min — ABNORMAL LOW (ref >60)
Glucose, Bld: 152 mg/dL — ABNORMAL HIGH (ref 70–99)
Potassium: 4.5 mmol/L (ref 3.5–5.1)
Sodium: 139 mmol/L (ref 135–145)

## 2024-09-03 MED ORDER — HYDROXYZINE HCL 10 MG PO TABS
10.0000 mg | ORAL_TABLET | Freq: Three times a day (TID) | ORAL | Status: DC | PRN
  Administered 2024-09-03: 10 mg via ORAL
  Filled 2024-09-03 (×2): qty 1

## 2024-09-03 NOTE — Progress Notes (Signed)
 [facility]  - [place] at [facility]   PATIENT NAME: Joan Parker    MR#:  [id number]  DATE OF BIRTH:  1946/04/05  SUBJECTIVE:  no family at bedside. Patient has history of chronic respiratory failure with emphysema on 2 L nasal cannula oxygen  came in with increasing shortness of breath. She is somewhat anxious. Denies any fever has some dry cough.    VITALS:  Blood pressure 118/71, pulse 87, temperature 97.7 F (36.5 C), temperature source Oral, resp. rate 20, height 5' 2 (1.575 m), weight 50.1 kg, SpO2 100%.  PHYSICAL EXAMINATION:   GENERAL:  78 y.o.-year-old patient with no acute distress. Weak deconditioned LUNGS: distant breath sounds bilaterally, no wheezing CARDIOVASCULAR: S1, S2 normal. No murmur   ABDOMEN: Soft, nontender, nondistended. EXTREMITIES: No  edema b/l.    NEUROLOGIC: nonfocal  patient is alert and awake.   LABORATORY PANEL:  CBC Recent Labs  Lab 09/03/24 0433  WBC 6.4  HGB 8.0*  HCT 28.6*  PLT 182    Chemistries  Recent Labs  Lab 09/03/24 0433  NA 139  K 4.5  CL 101  CO2 27  GLUCOSE 152*  BUN 26*  CREATININE 1.35*  CALCIUM 9.5    RADIOLOGY:  DG Chest 2 View Result Date: 09/02/2024 CLINICAL DATA:  Shortness of breath and cough worse for 3 days. Chronic oxygen  use. EXAM: CHEST - 2 VIEW COMPARISON:  06/08/2024 FINDINGS: Emphysematous changes in the lungs. No airspace disease or consolidation. No pleural effusion or pneumothorax. Mediastinal contours appear intact. Degenerative changes in the spine and shoulders. Midthoracic vertebral compression deformities are unchanged since prior CT 03/08/2024. Calcification of the aorta. IMPRESSION: Emphysematous changes in the lungs. No evidence of active pulmonary disease. Electronically Signed   By: Elsie Gravely M.D.   On: 09/02/2024 16:00    Assessment and Plan  From H and P  Joan Parker is a 78 y.o. female with medical history significant of COPD with chronic respiratory  failure on 2 L/min oxygen  at baseline, hypertension, GERD, RA, CKD stage IIIb with chronic anemia.  She presented to the ED today for evaluation of shortness of breath.  Patient reports 3 days of worsening shortness of breath and work of breathing over her baseline.  She increased use of her home inhalers without improvement.  Today, she needed to turn her oxygen  up to 3 and 4 L/min.   Chest x-ray showed emphysema but no acute findings.   COPD with acute exacerbation Acute on chronic respiratory failure with hypoxia --Baseline O2 requirement is 2 L/min, currently requiring 3 to 4 L/min.  Diffuse wheezing and severely diminished air movement on exam consistent with COPD exacerbation. -- Continue IV Solu-Medrol  40 mg twice daily --Scheduled DuoNebs Q6H -- As needed albuterol  nebs Q2H -- Scheduled Mucinex -- Breztri substitute for home Trelegy -- Pulmonary hygiene with I-S and flutter -- Supplement O2 to keep sats 88 to 94% -- white count normal. Chest x-ray no pneumonia. Will hold off antibiotic -- patient follows with [facility] pulmonology Dr. Theotis   Essential hypertension --BPs are controlled. Home regimen is amlodipine 10 mg, lisinopril 40 mg --Continue amlodipine -- Resume lisinopril tomorrow if BP warrants   Rheumatoid arthritis -without flare symptoms -- Continue leflunomide   GERD -- Continue PPI  Patient ambulated with mobility therapist got short winded.   Procedures: Family communication : none today Consults : none CODE STATUS full DVT Prophylaxis : Lovenox Level of care: Telemetry Medical Status is: Inpatient Remains inpatient appropriate  because: COPD flare    TOTAL TIME TAKING CARE OF THIS PATIENT: 35 minutes.  >50% time spent on counselling and coordination of care  Note: This dictation was prepared with Dragon dictation along with smaller phrase technology. Any transcriptional errors that result from this process are unintentional.  Joan Parker M.D    [facility]   CC: Primary care physician; Alla Amis, MD

## 2024-09-03 NOTE — Progress Notes (Signed)
 Mobility Specialist - Progress Note   Pre-mobility: SpO2(99%) During mobility:SpO2(85) pushed to 4L to recover Post-mobility:  SPO2(98%)     09/03/24 1103  Mobility  Activity Stood at bedside;Ambulated with assistance  Level of Assistance Standby assist, set-up cues, supervision of patient - no hands on  Assistive Device Front wheel walker  Distance Ambulated (ft) 20 ft  Range of Motion/Exercises Active  Activity Response Tolerated well  Mobility Referral Yes  Mobility visit 1 Mobility  Mobility Specialist Start Time (ACUTE ONLY) 1010  Mobility Specialist Stop Time (ACUTE ONLY) 1022  Mobility Specialist Time Calculation (min) (ACUTE ONLY) 12 min   Pt resting in bed on 2L upon entry. Pt desats to 85% EOB, Pt sits EOB for 2-3 minutes to recover. Pt STS and ambulates to door and back to recliner SBA with RW. Pt endorses heavy wheezing and SOB on return back to recliner (pushed to 4L to recover). Pt left in recliner with needs in reach. Chair alarm activated.   Guido Rumble Mobility Specialist 09/03/24, 11:21 AM

## 2024-09-03 NOTE — Plan of Care (Signed)
  Problem: Education: Goal: Knowledge of General Education information will improve Description: Including pain rating scale, medication(s)/side effects and non-pharmacologic comfort measures Outcome: Progressing   Problem: Clinical Measurements: Goal: Ability to maintain clinical measurements within normal limits will improve Outcome: Progressing Goal: Will remain free from infection Outcome: Progressing   Problem: Coping: Goal: Level of anxiety will decrease Outcome: Progressing   Problem: Elimination: Goal: Will not experience complications related to urinary retention Outcome: Progressing   Problem: Safety: Goal: Ability to remain free from injury will improve Outcome: Progressing

## 2024-09-03 NOTE — Plan of Care (Signed)
  Problem: Education: Goal: Knowledge of General Education information will improve Description: Including pain rating scale, medication(s)/side effects and non-pharmacologic comfort measures Outcome: Not Progressing   Problem: Health Behavior/Discharge Planning: Goal: Ability to manage health-related needs will improve Outcome: Not Progressing   Problem: Clinical Measurements: Goal: Ability to maintain clinical measurements within normal limits will improve Outcome: Not Progressing Goal: Will remain free from infection Outcome: Not Progressing Goal: Diagnostic test results will improve Outcome: Not Progressing Goal: Respiratory complications will improve Outcome: Not Progressing Goal: Cardiovascular complication will be avoided Outcome: Not Progressing   Problem: Activity: Goal: Risk for activity intolerance will decrease Outcome: Not Progressing   Problem: Nutrition: Goal: Adequate nutrition will be maintained Outcome: Not Progressing   Problem: Coping: Goal: Level of anxiety will decrease Outcome: Not Progressing   Problem: Elimination: Goal: Will not experience complications related to bowel motility Outcome: Not Progressing Goal: Will not experience complications related to urinary retention Outcome: Not Progressing   Problem: Pain Managment: Goal: General experience of comfort will improve and/or be controlled Outcome: Not Progressing   Problem: Safety: Goal: Ability to remain free from injury will improve Outcome: Not Progressing   Problem: Skin Integrity: Goal: Risk for impaired skin integrity will decrease Outcome: Not Progressing   Problem: Education: Goal: Knowledge of disease or condition will improve Outcome: Not Progressing Goal: Knowledge of the prescribed therapeutic regimen will improve Outcome: Not Progressing Goal: Individualized Educational Video(s) Outcome: Not Progressing   Problem: Activity: Goal: Ability to tolerate increased  activity will improve Outcome: Not Progressing Goal: Will verbalize the importance of balancing activity with adequate rest periods Outcome: Not Progressing   Problem: Respiratory: Goal: Ability to maintain a clear airway will improve Outcome: Not Progressing Goal: Levels of oxygenation will improve Outcome: Not Progressing Goal: Ability to maintain adequate ventilation will improve Outcome: Not Progressing

## 2024-09-03 NOTE — Progress Notes (Signed)
 Brief Assessment Note  Medical record reviewed and patient has no TOC needs at this time. Please outreach to [facility] if needs are identified.

## 2024-09-04 ENCOUNTER — Encounter: Payer: Self-pay | Admitting: Internal Medicine

## 2024-09-04 ENCOUNTER — Other Ambulatory Visit: Payer: Self-pay

## 2024-09-04 LAB — FERRITIN: Ferritin: 1167 ng/mL — ABNORMAL HIGH (ref 11–307)

## 2024-09-04 MED ORDER — AZITHROMYCIN 250 MG PO TABS
ORAL_TABLET | ORAL | 1 refills | Status: DC
  Filled 2024-09-04: qty 6, 5d supply, fill #0

## 2024-09-04 MED ORDER — PREDNISONE 10 MG PO TABS
ORAL_TABLET | ORAL | 0 refills | Status: AC
  Filled 2024-09-04: qty 30, 12d supply, fill #0

## 2024-09-04 NOTE — Discharge Summary (Signed)
 Joan Parker FMW:[id number] DOB: 1946/11/22 DOA: 09/02/2024  PCP: Alla Amis, MD  Admit date: 09/02/2024 Discharge date: 09/04/2024  Time spent: 35 minutes  Recommendations for Outpatient Follow-up:  Pcp f/u Pulmonology f/u next week as scheduled     Discharge Diagnoses:  Principal Problem:   COPD with acute exacerbation [facility])   Discharge Condition: improved  Diet recommendation: heart healthy  Filed Weights   09/02/24 2100  Weight: 50.1 kg    History of present illness:  From admission h and p Joan Parker is a 78 y.o. 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  She presented to the ED today for evaluation of shortness of breath.  Patient reports 3 days of worsening shortness of breath and work of breathing over her baseline.  She increased use of her home inhalers without improvement.  Today, she needed to turn her oxygen  up to 3 and 4 L/min.  She denies sick contacts, fevers chills, sore throat congestion cough or any recent illnesses.  Denies other symptoms as well including nausea vomit diarrhea, dysuria hematuria, weakness, numbness tingling.   ED course --temp 98.6 F, HR 85, RR 25, BP 120/80, SpO2 100% on 4 L nasal cannula O2.  Labs were obtained including BMP and CBC that were notable for nonfasting glucose 124, creatinine 1.44, hemoglobin 7.8.  Viral PCR is ordered and negative for COVID, flu and RSV.  Chest x-ray showed emphysema but no acute findings.  EKG showed normal sinus rhythm at 88 bpm without acute ischemic changes.  Troponin was normal at 7 twice.   Patient was treated with 125 mg IV Solu-Medrol  and multiple DuoNeb treatments but continued to have increased work of breathing, conversational dyspnea and diffuse wheezing.  Therefore patient is admitted for further management of COPD with acute exacerbation.  Hospital Course:    Patient with history advanced COPD presents with several days worsening wheeze and shortness of breath. Here she was diagnosed with a copd exacerbation and treated with corticosteroids. She reports significant improvement in symptoms and thinks she is now back to her baseline. Other chronic conditions stable. Will discharge home with steroid taper. Was not treated with antibiotics here and she reports essentially resolution of symptoms, so have held on prescribing antibiotics. Patient reports she has an appointment with her pulmonologist in one week, advise f/u there, or with pcp if for whatever reason can't see pulmonology.   Procedures: none   Consultations: none  Discharge Exam: Vitals:   09/04/24 0850 09/04/24 1354  BP: 121/72   Pulse: 83   Resp: (!) 21   Temp: 98.3 F (36.8 C)   SpO2: 99% 98%    General: NAD Cardiovascular: RRR Respiratory: exp wheeze Extremities: warm, no edema  Discharge Instructions   Discharge Instructions     Diet - low sodium heart healthy   Complete by: As directed    Increase activity slowly   Complete by: As directed       Allergies as of 09/04/2024   No Known Allergies      Medication List     TAKE these medications    amLODipine 10 MG tablet Commonly known as: NORVASC Take 1 tablet by mouth daily.   azithromycin 250 MG tablet Commonly known as: ZITHROMAX Take as directed: Two pills by mouth the first day, then one pill every day until completed   leflunomide 20 MG tablet Commonly  known as: ARAVA Take 20 mg by mouth daily.   lisinopril 40 MG tablet Commonly known as: ZESTRIL Take 40 mg by mouth daily.   omeprazole 20 MG capsule Commonly known as: PRILOSEC Take 1 capsule by mouth daily.   OXYGEN  Inhale 2 L/min into the lungs as needed (shortness of breath).   predniSONE 10 MG tablet Commonly known as: DELTASONE 4 tabs daily for 3 days then 3 tabs daily for 3 days then 2 tabs daily for 3 days then 1 tab daily for  3 days   Spiriva HandiHaler 18 MCG inhalation capsule Generic drug: tiotropium Take 1 capsule by mouth daily.   Trelegy Ellipta 100-62.5-25 MCG/ACT Aepb Generic drug: Fluticasone-Umeclidin-Vilant Inhale 1 puff into the lungs daily.   Ventolin  HFA 108 (90 Base) MCG/ACT inhaler Generic drug: albuterol  Inhale 2 Inhalers into the lungs every 4 (four) hours as needed for shortness of breath.   albuterol  (2.5 MG/3ML) 0.083% nebulizer solution Commonly known as: PROVENTIL  Inhale 2.5 mg into the lungs every 6 (six) hours as needed for wheezing.       No Known Allergies  Follow-up Information     Alla Amis, MD Follow up.   Specialty: Family Medicine Why: hospital follow up Contact information: [address] [facility] [place] [address] [phone]         Theotis Lavelle BRAVO, MD Follow up.   Specialty: Specialist Contact information: [address] [place] [address] [phone]                  The results of significant diagnostics from this hospitalization (including imaging, microbiology, ancillary and laboratory) are listed below for reference.    Significant Diagnostic Studies: DG Chest 2 View Result Date: 09/02/2024 CLINICAL DATA:  Shortness of breath and cough worse for 3 days. Chronic oxygen  use. EXAM: CHEST - 2 VIEW COMPARISON:  06/08/2024 FINDINGS: Emphysematous changes in the lungs. No airspace disease or consolidation. No pleural effusion or pneumothorax. Mediastinal contours appear intact. Degenerative changes in the spine and shoulders. Midthoracic vertebral compression deformities are unchanged since prior CT 03/08/2024. Calcification of the aorta. IMPRESSION: Emphysematous changes in the lungs. No evidence of active pulmonary disease. Electronically Signed   By: Elsie Gravely M.D.   On: 09/02/2024 16:00    Microbiology: Recent Results (from the past 240 hours)  Resp panel by RT-PCR (RSV, Flu A&B, Covid)  Anterior Nasal Swab     Status: None   Collection Time: 09/02/24  4:47 PM   Specimen: Anterior Nasal Swab  Result Value Ref Range Status   SARS Coronavirus 2 by RT PCR NEGATIVE NEGATIVE Final    Comment: (NOTE) SARS-CoV-2 target nucleic acids are NOT DETECTED.  The SARS-CoV-2 RNA is generally detectable in upper respiratory specimens during the acute phase of infection. The lowest concentration of SARS-CoV-2 viral copies this assay can detect is 138 copies/mL. A negative result does not preclude SARS-Cov-2 infection and should not be used as the sole basis for treatment or other patient management decisions. A negative result may occur with  improper specimen collection/handling, submission of specimen other than nasopharyngeal swab, presence of viral mutation(s) within the areas targeted by this assay, and inadequate number of viral copies(<138 copies/mL). A negative result must be combined with clinical observations, patient history, and epidemiological information. The expected result is Negative.  Fact Sheet for Patients:  BloggerCourse.com  Fact Sheet for Healthcare Providers:  SeriousBroker.it  This test is no t yet approved or cleared by the  [place]  FDA and  has been authorized for detection and/or diagnosis of SARS-CoV-2 by FDA under an Emergency Use Authorization (EUA). This EUA will remain  in effect (meaning this test can be used) for the duration of the COVID-19 declaration under Section 564(b)(1) of the Act, 21 U.S.C.section 360bbb-3(b)(1), unless the authorization is terminated  or revoked sooner.       Influenza A by PCR NEGATIVE NEGATIVE Final   Influenza B by PCR NEGATIVE NEGATIVE Final    Comment: (NOTE) The Xpert Xpress SARS-CoV-2/FLU/RSV plus assay is intended as an aid in the diagnosis of influenza from Nasopharyngeal swab specimens and should not be used as a sole basis for treatment. Nasal washings  and aspirates are unacceptable for Xpert Xpress SARS-CoV-2/FLU/RSV testing.  Fact Sheet for Patients: BloggerCourse.com  Fact Sheet for Healthcare Providers: SeriousBroker.it  This test is not yet approved or cleared by the [place]  FDA and has been authorized for detection and/or diagnosis of SARS-CoV-2 by FDA under an Emergency Use Authorization (EUA). This EUA will remain in effect (meaning this test can be used) for the duration of the COVID-19 declaration under Section 564(b)(1) of the Act, 21 U.S.C. section 360bbb-3(b)(1), unless the authorization is terminated or revoked.     Resp Syncytial Virus by PCR NEGATIVE NEGATIVE Final    Comment: (NOTE) Fact Sheet for Patients: BloggerCourse.com  Fact Sheet for Healthcare Providers: SeriousBroker.it  This test is not yet approved or cleared by the [place]  FDA and has been authorized for detection and/or diagnosis of SARS-CoV-2 by FDA under an Emergency Use Authorization (EUA). This EUA will remain in effect (meaning this test can be used) for the duration of the COVID-19 declaration under Section 564(b)(1) of the Act, 21 U.S.C. section 360bbb-3(b)(1), unless the authorization is terminated or revoked.  Performed at [facility], [address] [place], [place] [address]      Labs: Basic Metabolic Panel: Recent Labs  Lab 09/02/24 1451 09/03/24 0433  NA 139 139  K 5.0 4.5  CL 98 101  CO2 29 27  GLUCOSE 124* 152*  BUN 21 26*  CREATININE 1.44* 1.35*  CALCIUM 9.5 9.5   Liver Function Tests: No results for input(s): AST, ALT, ALKPHOS, BILITOT, PROT, ALBUMIN in the last 168 hours. No results for input(s): LIPASE, AMYLASE in the last 168 hours. No results for input(s): AMMONIA in the last 168 hours. CBC: Recent Labs  Lab 09/02/24 1451 09/03/24 0433  WBC 7.1 6.4  HGB 7.8* 8.0*   HCT 27.7* 28.6*  MCV 78.7* 77.7*  PLT 161 182   Cardiac Enzymes: No results for input(s): CKTOTAL, CKMB, CKMBINDEX, TROPONINI in the last 168 hours. BNP: BNP (last 3 results) No results for input(s): BNP in the last 8760 hours.  ProBNP (last 3 results) No results for input(s): PROBNP in the last 8760 hours.  CBG: No results for input(s): GLUCAP in the last 168 hours.     Signed:  Devaughn KATHEE Ban MD.  [facility] 09/04/2024, 3:50 PM

## 2024-09-04 NOTE — Plan of Care (Signed)
  Problem: Education: Goal: Knowledge of General Education information will improve Description: Including pain rating scale, medication(s)/side effects and non-pharmacologic comfort measures Outcome: Progressing   Problem: Health Behavior/Discharge Planning: Goal: Ability to manage health-related needs will improve Outcome: Progressing   Problem: Clinical Measurements: Goal: Ability to maintain clinical measurements within normal limits will improve Outcome: Progressing Goal: Will remain free from infection Outcome: Progressing Goal: Diagnostic test results will improve Outcome: Progressing Goal: Respiratory complications will improve Outcome: Progressing Goal: Cardiovascular complication will be avoided Outcome: Progressing   Problem: Elimination: Goal: Will not experience complications related to bowel motility Outcome: Progressing Goal: Will not experience complications related to urinary retention Outcome: Progressing   Problem: Skin Integrity: Goal: Risk for impaired skin integrity will decrease Outcome: Progressing   Problem: Education: Goal: Knowledge of disease or condition will improve Outcome: Progressing Goal: Knowledge of the prescribed therapeutic regimen will improve Outcome: Progressing Goal: Individualized Educational Video(s) Outcome: Progressing   Problem: Activity: Goal: Ability to tolerate increased activity will improve Outcome: Progressing Goal: Will verbalize the importance of balancing activity with adequate rest periods Outcome: Progressing

## 2024-12-10 ENCOUNTER — Encounter: Payer: Self-pay | Admitting: Internal Medicine

## 2024-12-11 ENCOUNTER — Telehealth: Payer: Self-pay | Admitting: Nurse Practitioner

## 2024-12-11 NOTE — Telephone Encounter (Signed)
 Per staff inbasket pt can see Tinnie Dawn in 1-2 weeks with labs.  I called and spoke with pt and offered multiple appts. Pt wanted afternoon appt and had other appts to schedule around. Appt scheduled and confirmed with pt for 1/27

## 2024-12-19 ENCOUNTER — Telehealth: Payer: Self-pay | Admitting: Nurse Practitioner
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
## 2024-12-24 ENCOUNTER — Inpatient Hospital Stay

## 2024-12-24 ENCOUNTER — Inpatient Hospital Stay: Admitting: Nurse Practitioner
